# Patient Record
Sex: Male | Born: 1980
Health system: Southern US, Community
[De-identification: ages and names within clinical notes are randomized; demographics above are authoritative.]

## PROBLEM LIST (undated history)

## (undated) DIAGNOSIS — A6000 Herpesviral infection of urogenital system, unspecified: Secondary | ICD-10-CM

## (undated) HISTORY — DX: Herpesviral infection of urogenital system, unspecified: A60.00

---

## 2007-04-18 ENCOUNTER — Emergency Department: Payer: Self-pay | Admitting: Emergency Medicine

## 2008-08-12 ENCOUNTER — Emergency Department: Payer: Self-pay | Admitting: Emergency Medicine

## 2008-09-28 ENCOUNTER — Emergency Department: Payer: Self-pay | Admitting: Emergency Medicine

## 2010-11-22 ENCOUNTER — Emergency Department: Payer: Self-pay | Admitting: Internal Medicine

## 2010-12-19 ENCOUNTER — Emergency Department: Payer: Self-pay | Admitting: Emergency Medicine

## 2012-06-02 ENCOUNTER — Emergency Department: Payer: Self-pay | Admitting: Emergency Medicine

## 2012-06-02 LAB — BASIC METABOLIC PANEL WITH GFR
Anion Gap: 7
BUN: 14 mg/dL
Calcium, Total: 8.8 mg/dL
Chloride: 105 mmol/L
Co2: 28 mmol/L
Creatinine: 1.44 mg/dL — ABNORMAL HIGH
EGFR (African American): >60
EGFR (Non-African Amer.): >60
Glucose: 84 mg/dL
Osmolality: 279
Potassium: 4.2 mmol/L
Sodium: 140 mmol/L

## 2012-06-02 LAB — CBC WITH DIFFERENTIAL/PLATELET
Basophil %: 1.2 %
Eosinophil #: 0.1 10*3/uL (ref 0.0–0.7)
HCT: 48.2 % (ref 40.0–52.0)
HGB: 15.1 g/dL (ref 13.0–18.0)
Lymphocyte #: 1.7 10*3/uL (ref 1.0–3.6)
Lymphocyte %: 30.1 %
MCH: 24.4 pg — ABNORMAL LOW (ref 26.0–34.0)
MCHC: 31.4 g/dL — ABNORMAL LOW (ref 32.0–36.0)
MCV: 78 fL — ABNORMAL LOW (ref 80–100)
Monocyte #: 0.3 x10 3/mm (ref 0.2–1.0)
Neutrophil #: 3.4 10*3/uL (ref 1.4–6.5)
WBC: 5.6 10*3/uL (ref 3.8–10.6)

## 2012-06-02 LAB — URINALYSIS, COMPLETE
Bacteria: NONE SEEN
Bilirubin,UR: NEGATIVE
Blood: NEGATIVE
Glucose,UR: NEGATIVE mg/dL (ref 0–75)
Ketone: NEGATIVE
Leukocyte Esterase: NEGATIVE
Ph: 7 (ref 4.5–8.0)
RBC,UR: NONE SEEN /HPF (ref 0–5)
Squamous Epithelial: <1

## 2012-06-05 ENCOUNTER — Emergency Department: Payer: Self-pay | Admitting: *Deleted

## 2012-09-30 ENCOUNTER — Emergency Department: Payer: Self-pay | Admitting: Emergency Medicine

## 2012-09-30 LAB — URINALYSIS, COMPLETE
Blood: NEGATIVE
Glucose,UR: NEGATIVE mg/dL (ref 0–75)
Ketone: NEGATIVE
Leukocyte Esterase: NEGATIVE
Nitrite: NEGATIVE
Ph: 8 (ref 4.5–8.0)
Protein: NEGATIVE
RBC,UR: NONE SEEN /HPF (ref 0–5)
Specific Gravity: 1.019 (ref 1.003–1.030)
Squamous Epithelial: NONE SEEN

## 2012-09-30 LAB — COMPREHENSIVE METABOLIC PANEL
Albumin: 4.3 g/dL (ref 3.4–5.0)
BUN: 19 mg/dL — ABNORMAL HIGH (ref 7–18)
Bilirubin,Total: 1 mg/dL (ref 0.2–1.0)
Calcium, Total: 9.2 mg/dL (ref 8.5–10.1)
Chloride: 105 mmol/L (ref 98–107)
Co2: 32 mmol/L (ref 21–32)
Creatinine: 1.44 mg/dL — ABNORMAL HIGH (ref 0.60–1.30)
EGFR (African American): >60
EGFR (Non-African Amer.): >60
Glucose: 88 mg/dL (ref 65–99)
Osmolality: 281 (ref 275–301)
SGPT (ALT): 25 U/L (ref 12–78)
Sodium: 140 mmol/L (ref 136–145)

## 2012-09-30 LAB — CBC
HGB: 15.4 g/dL (ref 13.0–18.0)
MCHC: 31.3 g/dL — ABNORMAL LOW (ref 32.0–36.0)
RDW: 15.9 % — ABNORMAL HIGH (ref 11.5–14.5)
WBC: 6.8 10*3/uL (ref 3.8–10.6)

## 2012-09-30 LAB — CK TOTAL AND CKMB (NOT AT ARMC)
CK, Total: 218 U/L (ref 35–232)
CK-MB: 2.2 ng/mL (ref 0.5–3.6)

## 2012-09-30 LAB — LIPASE, BLOOD: Lipase: 93 U/L (ref 73–393)

## 2013-08-08 IMAGING — CT CT HEAD WITHOUT CONTRAST
2 series · 16 of 30 positions shown, 20 images · non-contrast
Comparison: none

REASON FOR EXAM: lightheadedness
COMMENTS:

PROCEDURE:     CT  - CT HEAD WITHOUT CONTRAST  - June 02, 2012  [DATE]
RESULT:     History: Lightheadedness.
Comparison Study: No prior.

[Series 2: without · axial · non-contrast · 0.44mm/px · z∈[-147,-22]mm · 13 of 31 slices shown, 17 images]
[im 3/31  brain]
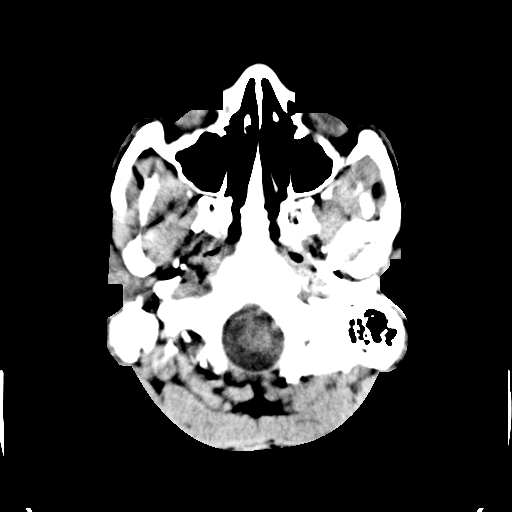
[im 3/31  bone]
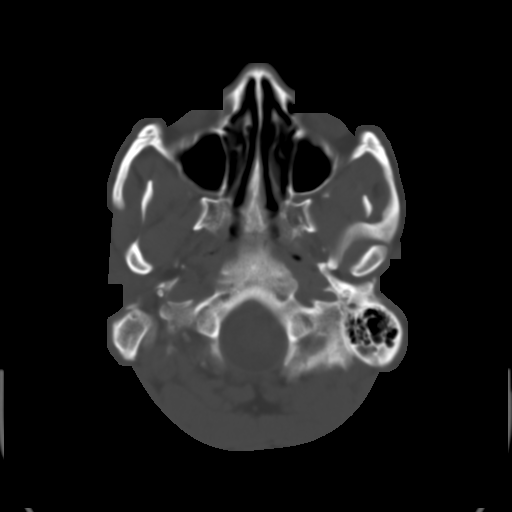
[im 5/31  brain]
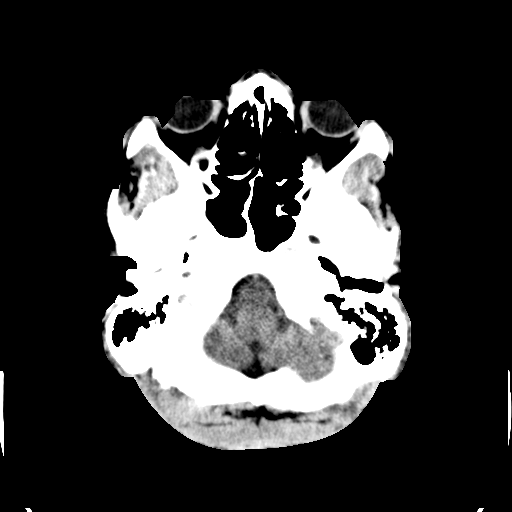
[im 7/31  brain]
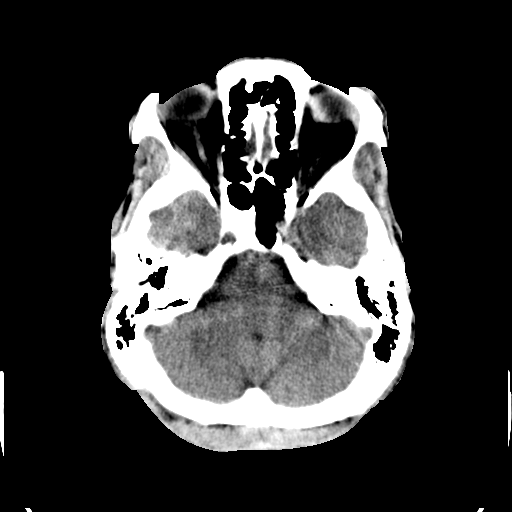
[im 9/31  brain]
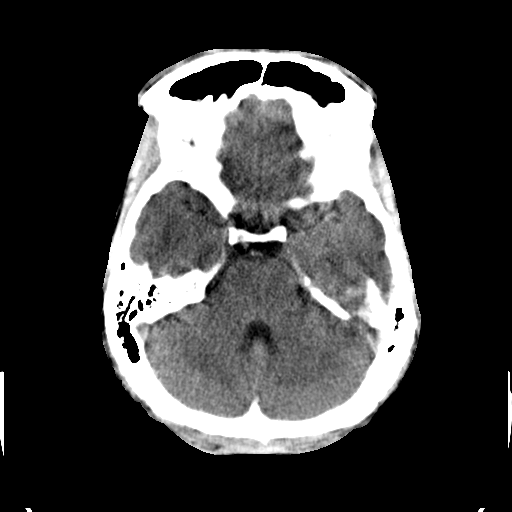
[im 11/31  brain]
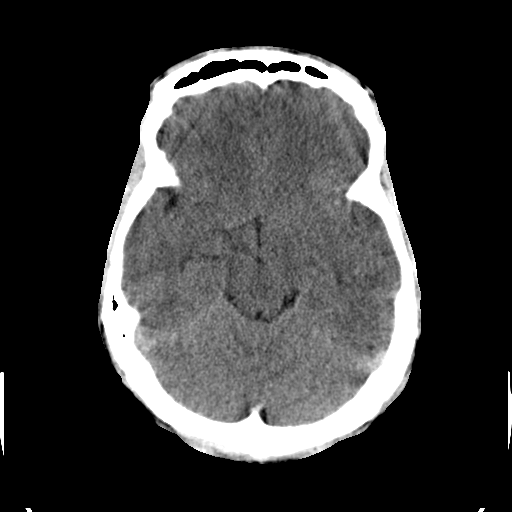
[im 11/31  bone]
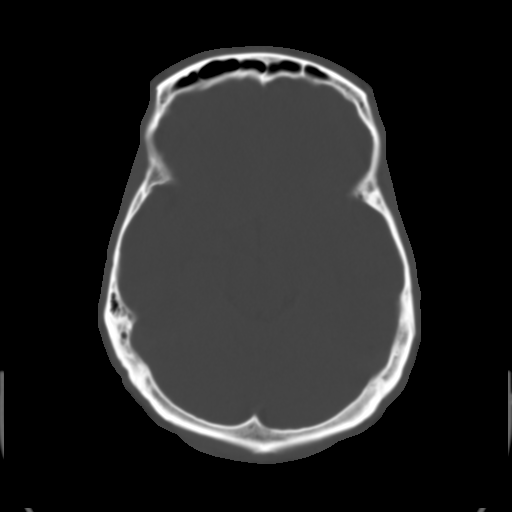
[im 13/31  brain]
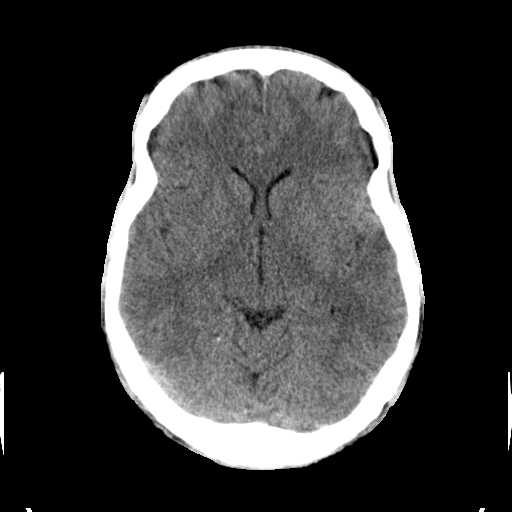
[im 16/31  brain]
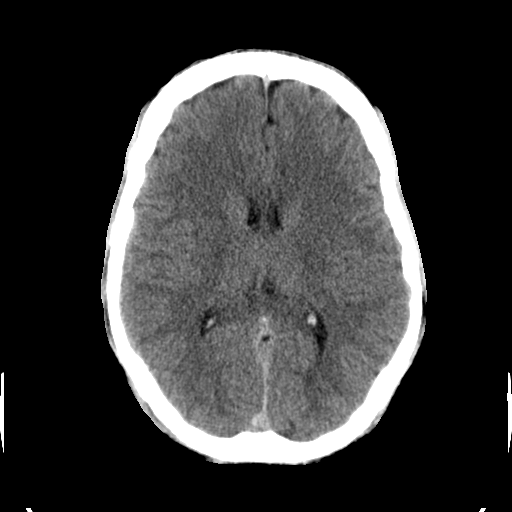
[im 18/31  brain]
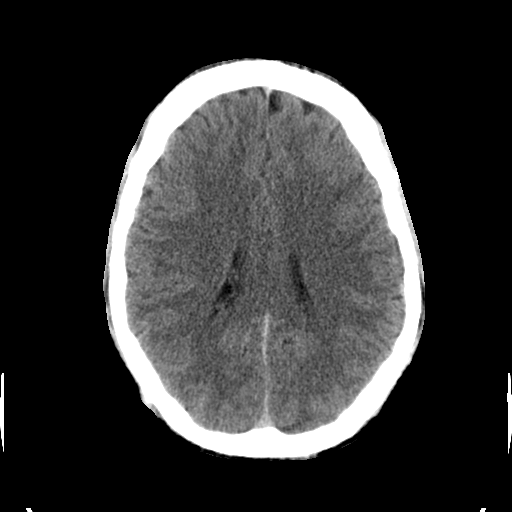
[im 20/31  brain]
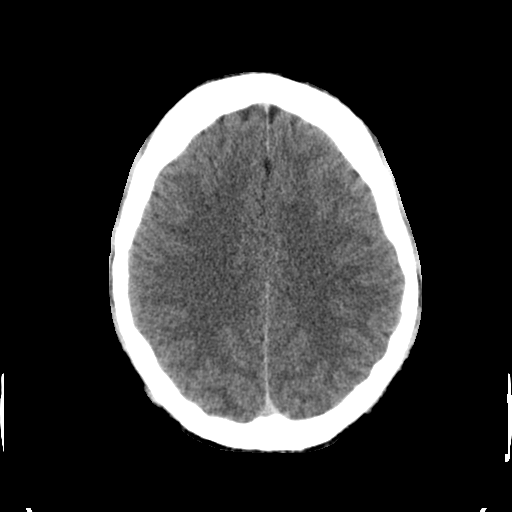
[im 20/31  bone]
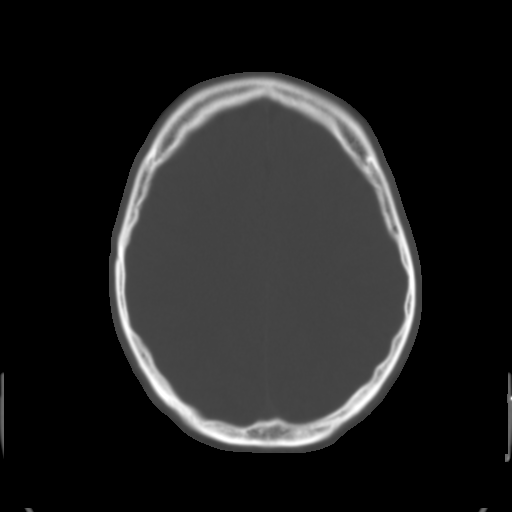
[im 22/31  brain]
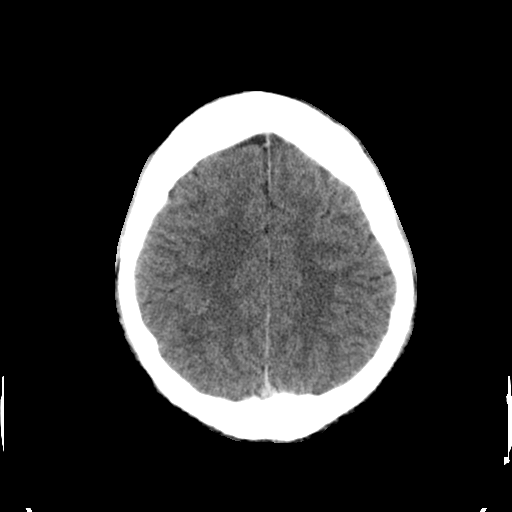
[im 24/31  brain]
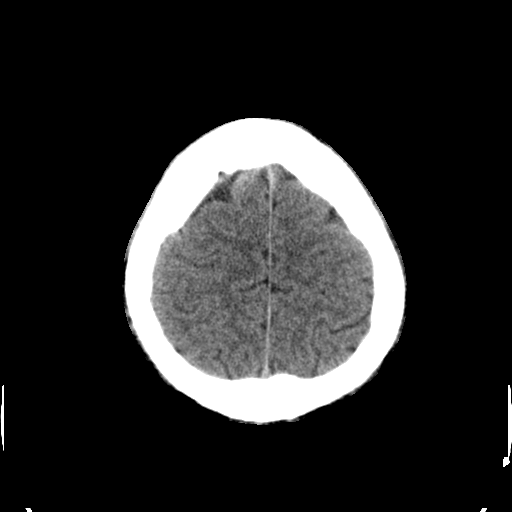
[im 26/31  brain]
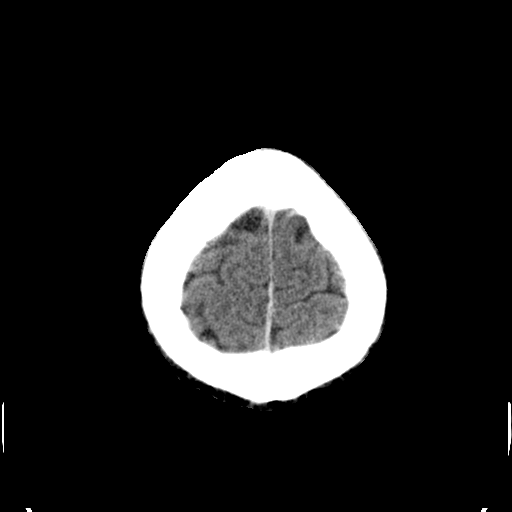
[im 28/31  brain]
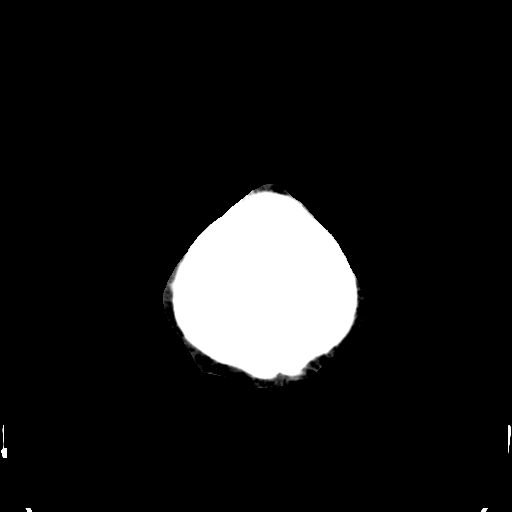
[im 28/31  bone]
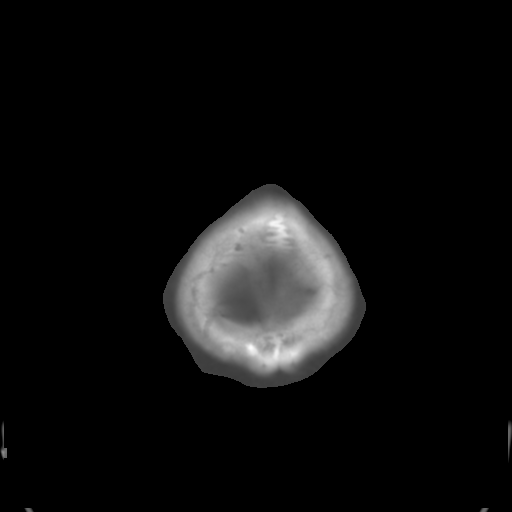

[Series 3: bone · axial · 0.44mm/px · z∈[-147,-107]mm · 3 of 31 slices shown]
[im 3/31  bone]
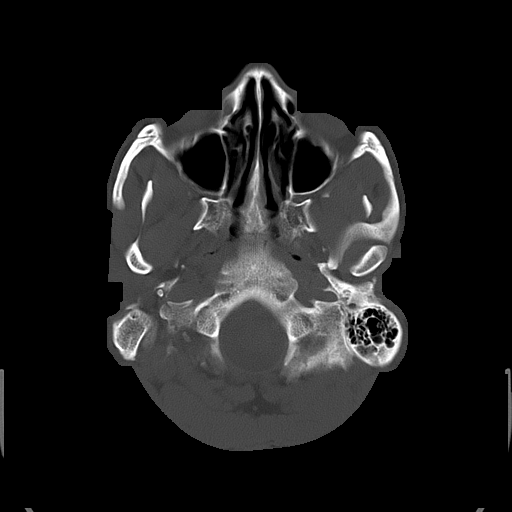
[im 7/31  bone]
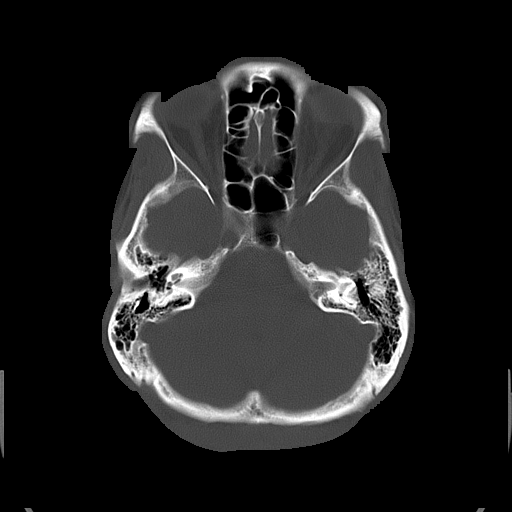
[im 11/31  bone]
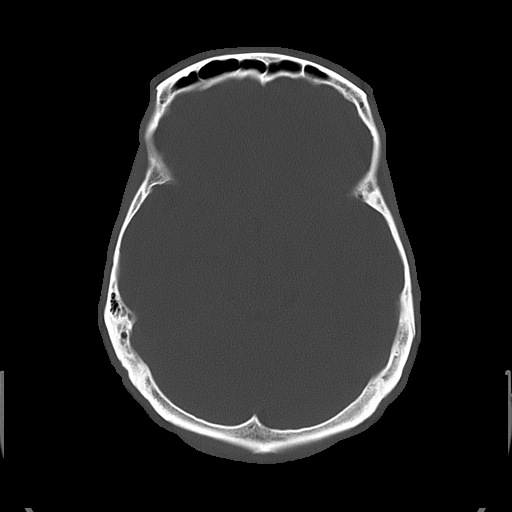

[16 of 30 positions shown; findings below may reference images not displayed]

FINDINGS: No mass. No hydrocephalus. No hemorrhage. Orbits and visualized
paranasal sinuses are clear. No acute bony abnormality.
IMPRESSION: No acute abnormality.

## 2013-12-06 IMAGING — CR DG CHEST 2V
1 series · 2 of 2 positions shown · non-contrast
Comparison: none

REASON FOR EXAM: chest pain
COMMENTS:

[Series 1: pa · 0.17mm/px · 2 of 2 slices shown]
[im 1/2]
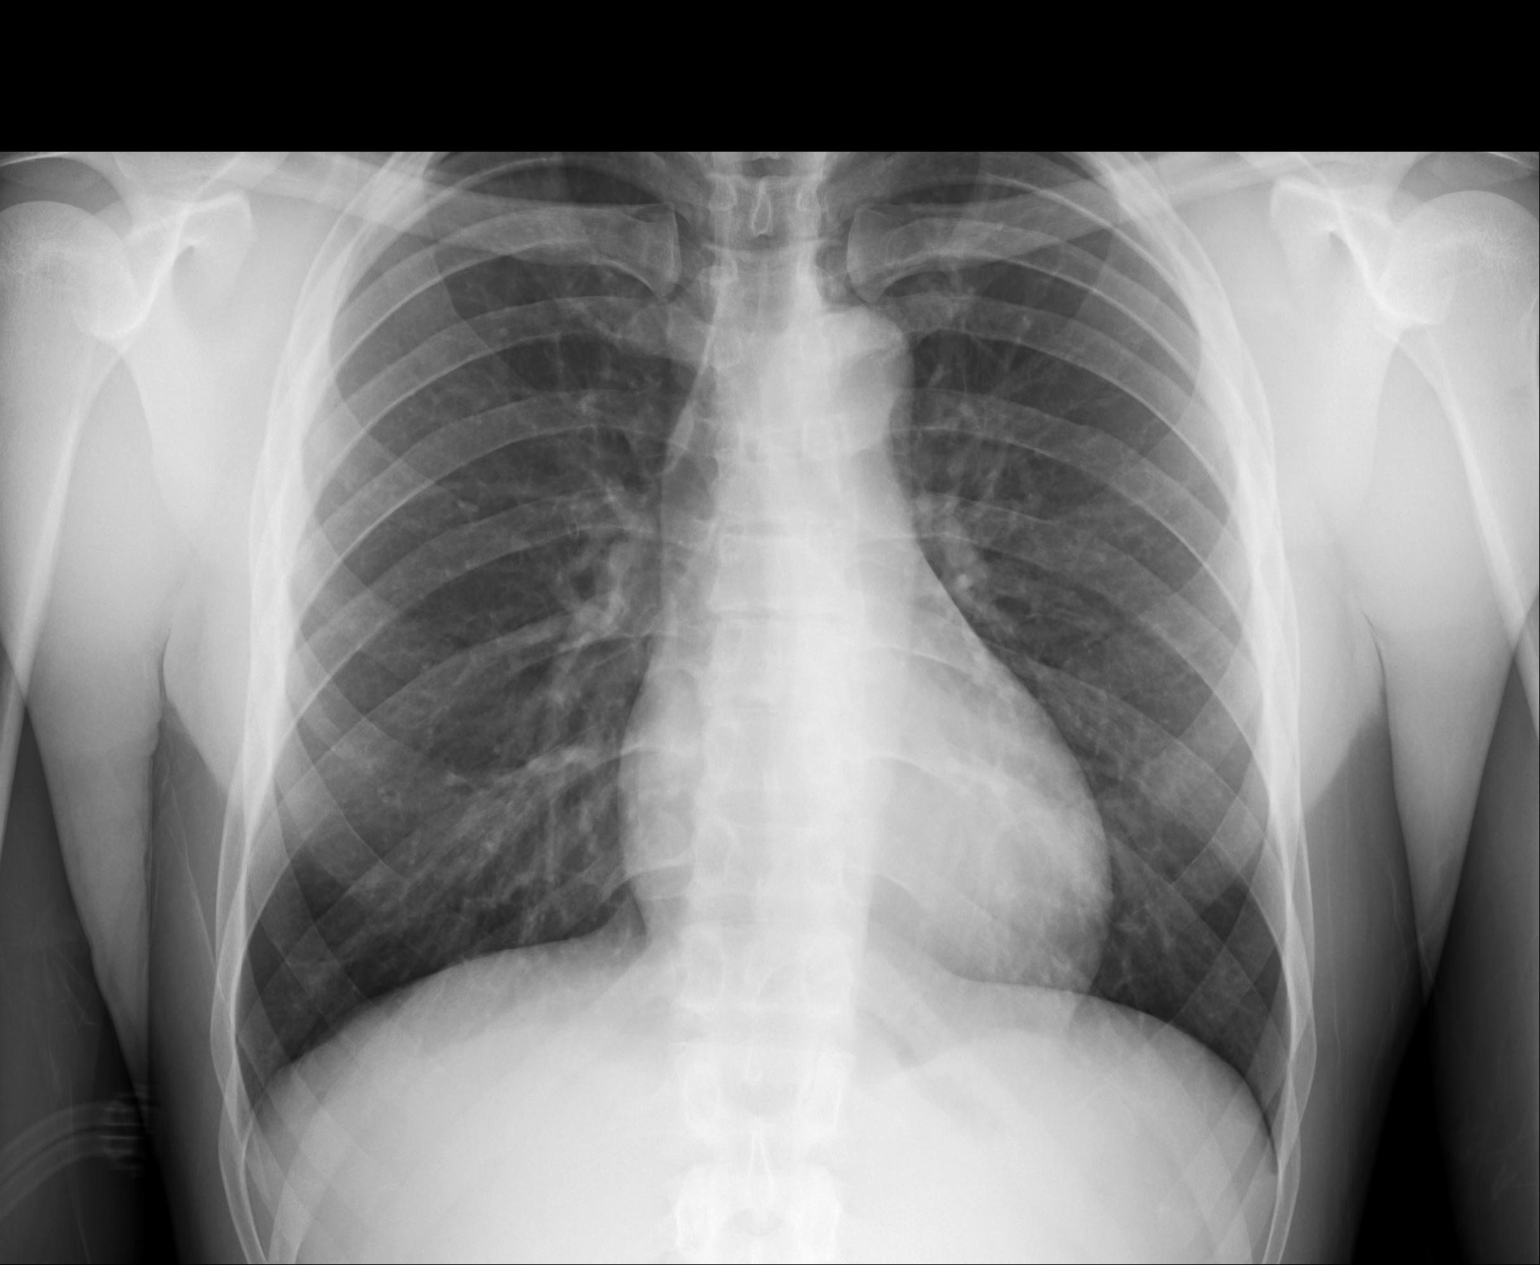
[im 2/2]
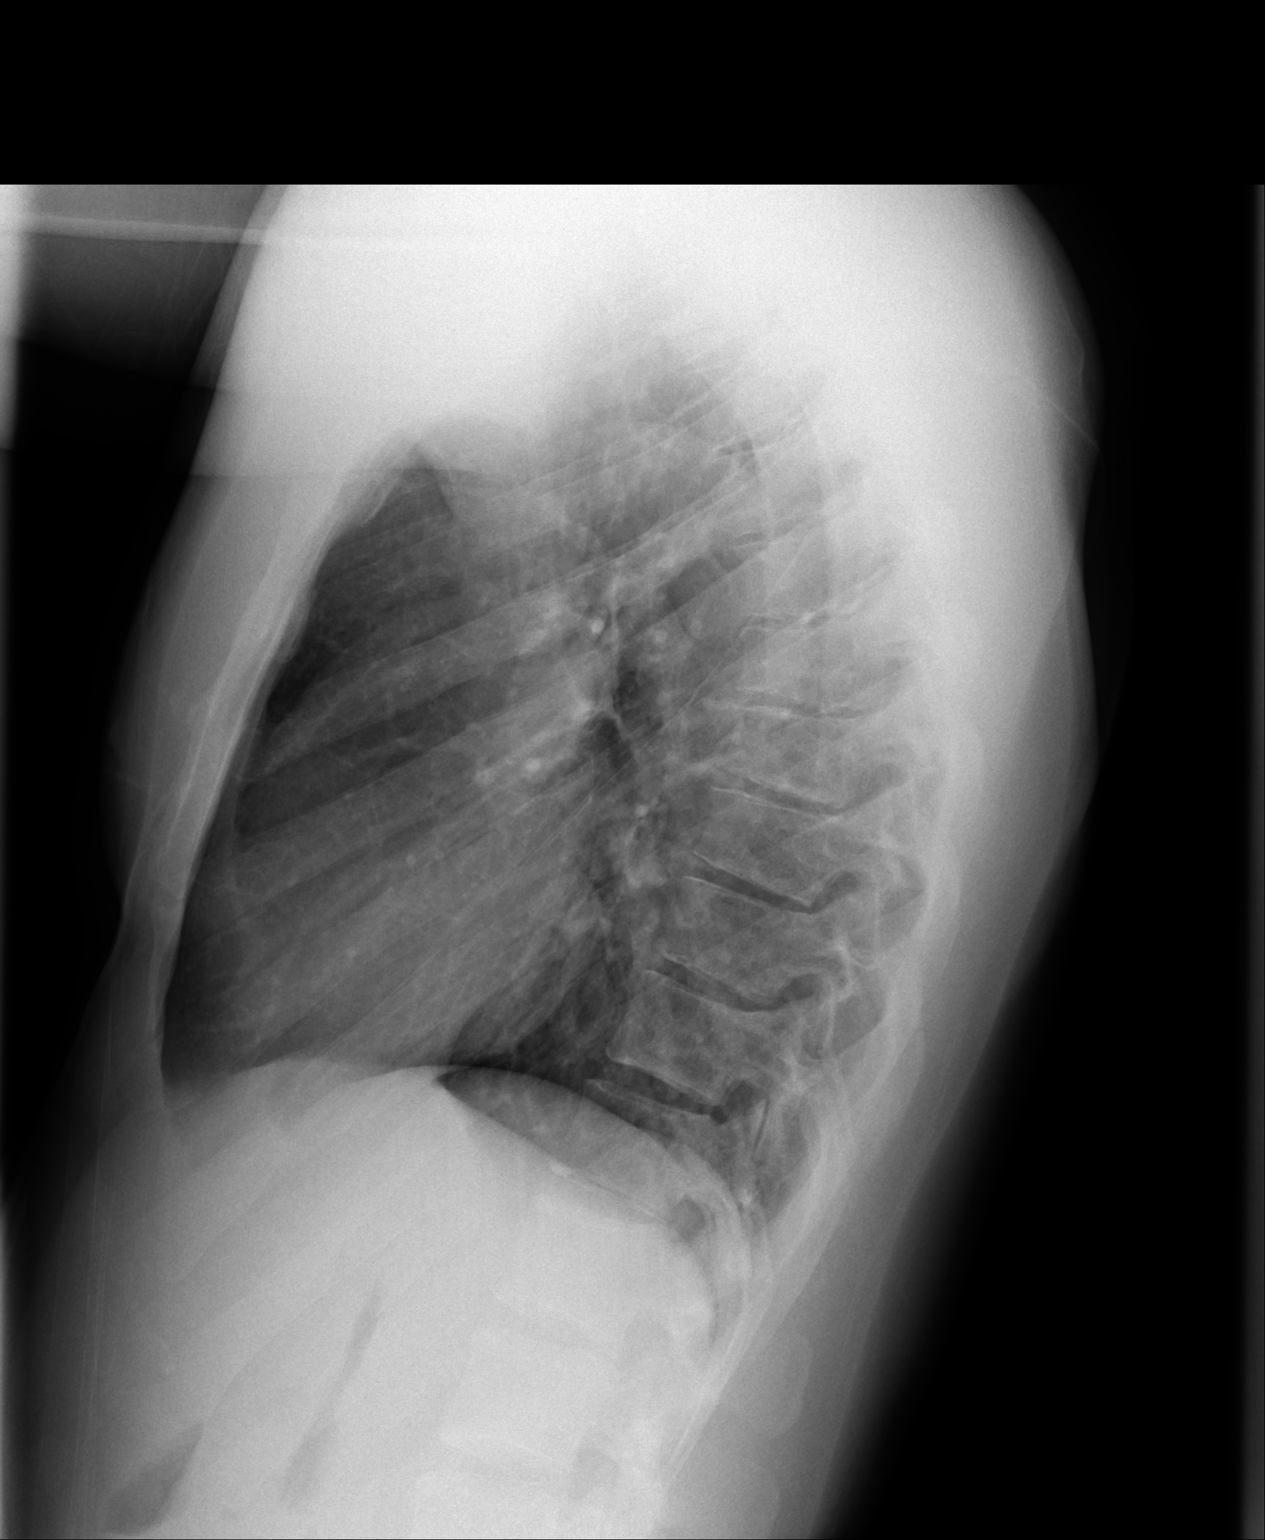

[2 of 2 positions shown; findings below may reference images not displayed]

PROCEDURE:     DXR - DXR CHEST PA (OR AP) AND LATERAL  - September 30, 2012 [DATE]

RESULT:     Comparison is made to study June 02, 2012.

The lungs are mildly hyperinflated and clear. The cardiac silhouette is
normal in size. The mediastinum is normal in width. There is no pleural
effusion. The bony thorax exhibits no acute abnormality. There is no
evidence of a pneumothorax nor pneumomediastinum.
IMPRESSION: There is no evidence of acute cardiopulmonary abnormality.
Mild hyperinflation may be voluntary or could reflect underlying reactive
airway disease.

[REDACTED]

## 2015-07-26 ENCOUNTER — Other Ambulatory Visit: Payer: Self-pay | Admitting: Family Medicine

## 2015-08-24 ENCOUNTER — Other Ambulatory Visit: Payer: Self-pay | Admitting: Family Medicine

## 2015-08-24 ENCOUNTER — Encounter: Payer: Self-pay | Admitting: Family Medicine

## 2015-08-24 ENCOUNTER — Ambulatory Visit (INDEPENDENT_AMBULATORY_CARE_PROVIDER_SITE_OTHER): Payer: Managed Care, Other (non HMO) | Admitting: Family Medicine

## 2015-08-24 VITALS — BP 150/100 | HR 60 | Resp 16 | Ht 69.0 in | Wt 166.4 lb

## 2015-08-24 DIAGNOSIS — Z789 Other specified health status: Secondary | ICD-10-CM | POA: Diagnosis not present

## 2015-08-24 DIAGNOSIS — A6000 Herpesviral infection of urogenital system, unspecified: Secondary | ICD-10-CM

## 2015-08-24 DIAGNOSIS — I10 Essential (primary) hypertension: Secondary | ICD-10-CM | POA: Diagnosis not present

## 2015-08-24 DIAGNOSIS — Z23 Encounter for immunization: Secondary | ICD-10-CM | POA: Diagnosis not present

## 2015-08-24 MED ORDER — VALACYCLOVIR HCL 500 MG PO TABS: 500.0000 mg | ORAL_TABLET | Freq: Two times a day (BID) | ORAL | Status: DC

## 2015-08-24 MED ORDER — LISINOPRIL 5 MG PO TABS: 5.0000 mg | ORAL_TABLET | Freq: Every day | ORAL | Status: DC

## 2015-08-24 NOTE — Addendum Note (Signed)
Addended by: Venora Maples on: 08/24/2015 05:53 PM   Modules accepted: Orders

## 2015-08-24 NOTE — Progress Notes (Signed)
I have reordered this med at [facility] in [place] already.-jh

## 2015-08-24 NOTE — Progress Notes (Signed)
Name: Carl Rice   MRN: [id number]    DOB: 12/18/1980   Date:08/24/2015       Progress Note  Subjective  Chief Complaint  Chief Complaint  Patient presents with  . Annual Exam    HPI  Here for annual physical exam.  No medical problems.  No surgery.  Nothing bothering him.   Has hx. Of genital herpes.  Had taken acyclovir in past.  No problem-specific assessment & plan notes found for this encounter.   Past Medical History  Diagnosis Date  . Genital herpes     History reviewed. No pertinent past surgical history.  Family History  Problem Relation Age of Onset  . Hypertension Mother   . Drug abuse Mother   . Drug abuse Father   . Kidney disease Son     Social History   Social History  . Marital Status: Single    Spouse Name: N/A  . Number of Children: N/A  . Years of Education: N/A   Occupational History  . Not on file.   Social History Main Topics  . Smoking status: Never Smoker   . Smokeless tobacco: Never Used  . Alcohol Use: Yes  . Drug Use: Yes  . Sexual Activity: Not on file   Other Topics Concern  . Not on file   Social History Narrative  . No narrative on file     Current outpatient prescriptions:  .  lisinopril (PRINIVIL,ZESTRIL) 5 MG tablet, Take 1 tablet (5 mg total) by mouth daily., Disp: 90 tablet, Rfl: 3 .  valACYclovir (VALTREX) 500 MG tablet, Take 1 tablet (500 mg total) by mouth 2 (two) times daily., Disp: 10 tablet, Rfl: 12  No Known Allergies   Review of Systems  Constitutional: Negative for fever, chills, weight loss and malaise/fatigue.  HENT: Negative for hearing loss.   Eyes: Negative for blurred vision and double vision.  Respiratory: Negative for cough, sputum production, shortness of breath and wheezing.   Cardiovascular: Negative for chest pain, palpitations, orthopnea, leg swelling and PND.  Gastrointestinal: Negative for heartburn, nausea, vomiting, abdominal pain and blood in stool.  Genitourinary: Negative for  dysuria, urgency and frequency.  Musculoskeletal: Negative for myalgias, back pain and neck pain.  Skin: Negative for rash.       Hx. Of genital herpes about 2-4 times a year.  Neurological: Negative for dizziness, sensory change, focal weakness, weakness and headaches.  Psychiatric/Behavioral: Negative for depression. The patient is not nervous/anxious.       Objective  Filed Vitals:   08/24/15 1623 08/24/15 1725  BP: 142/93 150/100  Pulse: 60   Resp: 16   Height:  (1.753 m)   Weight: 166 lb 6.4 oz (75.479 kg)     Physical Exam  Constitutional: He is oriented to person, place, and time and well-developed, well-nourished, and in no distress. No distress.  HENT:  Head: Normocephalic and atraumatic.  Right Ear: External ear normal.  Left Ear: External ear normal.  Nose: Nose normal.  Mouth/Throat: Oropharynx is clear and moist.  Eyes: Conjunctivae and EOM are normal. Pupils are equal, round, and reactive to light. No scleral icterus.  Fundoscopic exam:      The right eye shows no arteriolar narrowing, no AV nicking, no exudate and no hemorrhage.       The left eye shows no arteriolar narrowing, no AV nicking, no exudate and no hemorrhage.  Neck: Normal range of motion. Neck supple. Carotid bruit is not present. No thyromegaly present.  Cardiovascular: Normal rate, regular rhythm, normal heart sounds and intact distal pulses.  Exam reveals no gallop and no friction rub.   No murmur heard. Pulmonary/Chest: Effort normal and breath sounds normal. No respiratory distress. He has no wheezes. He has no rales.  Abdominal: Soft. He exhibits no distension, no abdominal bruit and no mass. There is no tenderness.  Genitourinary: Penis normal. He exhibits no abnormal testicular mass, no testicular tenderness, no abnormal scrotal mass and no scrotal tenderness. Penis exhibits no lesions. No discharge found.  Musculoskeletal: Normal range of motion. He exhibits no edema or tenderness.   Lymphadenopathy:    He has no cervical adenopathy.  Neurological: He is alert and oriented to person, place, and time.  Skin: Skin is warm and dry. No rash noted. No erythema. No pallor.  Vitals reviewed.      No results found for this or any previous visit (from the past 2160 hour(s)).   Assessment & Plan  Problem List Items Addressed This Visit      Cardiovascular and Mediastinum   Essential hypertension   Relevant Medications   lisinopril (PRINIVIL,ZESTRIL) 5 MG tablet   Other Relevant Orders   Comprehensive Metabolic Panel (CMET)   Lipid Profile   CBC with Differential     Genitourinary   Genital herpes   Relevant Medications   valACYclovir (VALTREX) 500 MG tablet     Other   Health maintenance alteration - Primary   Need for influenza vaccination   Relevant Orders   Flu Vaccine QUAD 36+ mos PF IM (Fluarix & Fluzone Quad PF)      Meds ordered this encounter  Medications  . DISCONTD: acyclovir (ZOVIRAX) 800 MG tablet    Sig:     Refill:  1  . DISCONTD: valACYclovir (VALTREX) 500 MG tablet    Sig: Take 1 tablet (500 mg total) by mouth 2 (two) times daily.    Dispense:  10 tablet    Refill:  12  . lisinopril (PRINIVIL,ZESTRIL) 5 MG tablet    Sig: Take 1 tablet (5 mg total) by mouth daily.    Dispense:  90 tablet    Refill:  3  . valACYclovir (VALTREX) 500 MG tablet    Sig: Take 1 tablet (500 mg total) by mouth 2 (two) times daily.    Dispense:  10 tablet    Refill:  12   1. Health maintenance alteration   2. Genital herpes  - valACYclovir (VALTREX) 500 MG tablet; Take 1 tablet (500 mg total) by mouth 2 (two) times daily.  Dispense: 10 tablet; Refill: 12  3. Essential hypertension  - lisinopril (PRINIVIL,ZESTRIL) 5 MG tablet; Take 1 tablet (5 mg total) by mouth daily.  Dispense: 90 tablet; Refill: 3 - Comprehensive Metabolic Panel (CMET) - Lipid Profile - CBC with Differential  4. Need for influenza vaccination  - Flu Vaccine QUAD 36+ mos PF  IM (Fluarix & Fluzone Quad PF)

## 2015-08-24 NOTE — Progress Notes (Signed)
Medication reordered.-jh

## 2015-10-26 ENCOUNTER — Ambulatory Visit (INDEPENDENT_AMBULATORY_CARE_PROVIDER_SITE_OTHER): Payer: Managed Care, Other (non HMO) | Admitting: Family Medicine

## 2015-10-26 ENCOUNTER — Encounter: Payer: Self-pay | Admitting: Family Medicine

## 2015-10-26 VITALS — BP 150/100 | HR 64 | Resp 16 | Ht 69.0 in | Wt 164.6 lb

## 2015-10-26 DIAGNOSIS — I10 Essential (primary) hypertension: Secondary | ICD-10-CM

## 2015-10-26 MED ORDER — LISINOPRIL 10 MG PO TABS: 10.0000 mg | ORAL_TABLET | Freq: Every day | ORAL | Status: DC

## 2015-10-26 NOTE — Progress Notes (Signed)
Name: Carl Rice   MRN: [id number]    DOB: [birth year]   Date:10/26/2015       Progress Note  Subjective  Chief Complaint  Chief Complaint  Patient presents with  . Hypertension    HPI Here for f/u of HBP.  He took Lisinopril for 1 week, but then he stopped it.  Wanted to do without it.  Now realizes that he will need it.  No problem-specific assessment & plan notes found for this encounter.   Past Medical History  Diagnosis Date  . Genital herpes     History reviewed. No pertinent past surgical history.  Family History  Problem Relation Age of Onset  . Hypertension Mother   . Drug abuse Mother   . Drug abuse Father   . Kidney disease Son     Social History   Social History  . Marital Status: Single    Spouse Name: N/A  . Number of Children: N/A  . Years of Education: N/A   Occupational History  . Not on file.   Social History Main Topics  . Smoking status: Never Smoker   . Smokeless tobacco: Never Used  . Alcohol Use: Yes  . Drug Use: Yes  . Sexual Activity: Not on file   Other Topics Concern  . Not on file   Social History Narrative     Current outpatient prescriptions:  .  valACYclovir (VALTREX) 500 MG tablet, Take 1 tablet (500 mg total) by mouth 2 (two) times daily. Take for 5 days when needed., Disp: 10 tablet, Rfl: 12 .  lisinopril (PRINIVIL,ZESTRIL) 10 MG tablet, Take 1 tablet (10 mg total) by mouth daily., Disp: 30 tablet, Rfl: 6  No Known Allergies   Review of Systems  Constitutional: Negative for fever, chills, weight loss and malaise/fatigue.  HENT: Negative for hearing loss.   Eyes: Negative for blurred vision and double vision.  Respiratory: Negative for cough, shortness of breath and wheezing.   Cardiovascular: Negative for chest pain, palpitations and leg swelling.  Gastrointestinal: Negative for heartburn, abdominal pain and blood in stool.  Genitourinary: Negative for dysuria, urgency and frequency.  Musculoskeletal: Negative  for myalgias and joint pain.  Neurological: Negative for dizziness, tremors and weakness.      Objective  Filed Vitals:   10/26/15 1613 10/26/15 1634  BP: 154/103 150/100  Pulse: 64   Resp: 16   Height: 5\' 9"  (1.753 m)   Weight: 164 lb 9.6 oz (74.662 kg)     Physical Exam  Constitutional: He is well-developed, well-nourished, and in no distress. No distress.  HENT:  Head: Normocephalic and atraumatic.  Eyes: Conjunctivae and EOM are normal. Pupils are equal, round, and reactive to light. No scleral icterus.  Neck: Normal range of motion. Carotid bruit is not present. No thyromegaly present.  Cardiovascular: Normal rate, regular rhythm, normal heart sounds and intact distal pulses.  Exam reveals no gallop and no friction rub.   No murmur heard. Pulmonary/Chest: Effort normal and breath sounds normal. No respiratory distress. He has no wheezes. He has no rales.  Abdominal: Soft. Bowel sounds are normal. He exhibits no distension, no abdominal bruit and no mass. There is no tenderness.  Musculoskeletal: He exhibits no edema.  Lymphadenopathy:    He has no cervical adenopathy.  Vitals reviewed.      No results found for this or any previous visit (from the past 2160 hour(s)).   Assessment & Plan  Problem List Items Addressed This Visit  Cardiovascular and Mediastinum   Essential hypertension - Primary   Relevant Medications   lisinopril (PRINIVIL,ZESTRIL) 10 MG tablet      Meds ordered this encounter  Medications  . lisinopril (PRINIVIL,ZESTRIL) 10 MG tablet    Sig: Take 1 tablet (10 mg total) by mouth daily.    Dispense:  30 tablet    Refill:  6  1. Essential hypertension  - lisinopril (PRINIVIL,ZESTRIL) 10 MG tablet; Take 1 tablet (10 mg total) by mouth daily.  Dispense: 30 tablet; Refill: 6  (increased from 5 mg/d).

## 2015-12-02 ENCOUNTER — Ambulatory Visit (INDEPENDENT_AMBULATORY_CARE_PROVIDER_SITE_OTHER): Payer: Managed Care, Other (non HMO) | Admitting: Family Medicine

## 2015-12-02 ENCOUNTER — Encounter: Payer: Self-pay | Admitting: Family Medicine

## 2015-12-02 VITALS — BP 145/105 | HR 59 | Temp 97.8°F | Resp 16 | Ht 69.0 in | Wt 163.0 lb

## 2015-12-02 DIAGNOSIS — I10 Essential (primary) hypertension: Secondary | ICD-10-CM

## 2015-12-02 MED ORDER — LISINOPRIL 40 MG PO TABS: 40.0000 mg | ORAL_TABLET | Freq: Every day | ORAL | Status: DC

## 2015-12-02 NOTE — Progress Notes (Signed)
Name: Carl Rice   MRN: [id number]    DOB: Sep 29, 1981   Date:12/02/2015       Progress Note  Subjective  Chief Complaint  Chief Complaint  Patient presents with  . Hypertension    HPI Here for f/u of HBP.  Taking Lisinopril 10 mg/d as directed.  Feeling ok.  No problems with medication.  No problem-specific assessment & plan notes found for this encounter.   Past Medical History  Diagnosis Date  . Genital herpes     History reviewed. No pertinent past surgical history.  Family History  Problem Relation Age of Onset  . Hypertension Mother   . Drug abuse Mother   . Drug abuse Father   . Kidney disease Son     Social History   Social History  . Marital Status: Single    Spouse Name: N/A  . Number of Children: N/A  . Years of Education: N/A   Occupational History  . Not on file.   Social History Main Topics  . Smoking status: Never Smoker   . Smokeless tobacco: Never Used  . Alcohol Use: 0.0 oz/week    0 Standard drinks or equivalent per week  . Drug Use: No  . Sexual Activity: Not on file   Other Topics Concern  . Not on file   Social History Narrative     Current outpatient prescriptions:  .  valACYclovir (VALTREX) 500 MG tablet, Take 1 tablet (500 mg total) by mouth 2 (two) times daily. Take for 5 days when needed., Disp: 10 tablet, Rfl: 12 .  lisinopril (PRINIVIL,ZESTRIL) 40 MG tablet, Take 1 tablet (40 mg total) by mouth daily., Disp: 30 tablet, Rfl: 6  Not on File   Review of Systems  Constitutional: Negative for fever, chills, weight loss and malaise/fatigue.  HENT: Negative for hearing loss.   Eyes: Negative for blurred vision and double vision.  Respiratory: Negative for cough, shortness of breath and wheezing.   Cardiovascular: Negative for chest pain, palpitations and leg swelling.  Gastrointestinal: Negative for heartburn, abdominal pain and blood in stool.  Genitourinary: Negative for dysuria, urgency and frequency.  Musculoskeletal:  Negative for myalgias and joint pain.  Skin: Negative for rash.  Neurological: Negative for dizziness, tremors, weakness and headaches.      Objective  Filed Vitals:   12/02/15 1424 12/02/15 1507  BP: 148/105 145/105  Pulse: 59   Temp: 97.8 F (36.6 C)   TempSrc: Oral   Resp: 16   Height: 5\' 9"  (1.753 m)   Weight: 163 lb (73.936 kg)     Physical Exam  Constitutional: He is well-developed, well-nourished, and in no distress. No distress.  HENT:  Head: Normocephalic and atraumatic.  Eyes: Conjunctivae and EOM are normal. Pupils are equal, round, and reactive to light. No scleral icterus.  Neck: Normal range of motion. Neck supple. Carotid bruit is not present. No thyromegaly present.  Cardiovascular: Normal rate, regular rhythm and normal heart sounds.  Exam reveals no gallop and no friction rub.   No murmur heard. Pulmonary/Chest: Effort normal and breath sounds normal. No respiratory distress. He has no wheezes. He has no rales.  Abdominal: Soft. Bowel sounds are normal. He exhibits no distension, no abdominal bruit and no mass. There is no tenderness.  Musculoskeletal: He exhibits no edema.  Lymphadenopathy:    He has no cervical adenopathy.  Vitals reviewed.      No results found for this or any previous visit (from the past 2160 hour(s)).  Assessment & Plan  Problem List Items Addressed This Visit      Cardiovascular and Mediastinum   Essential hypertension - Primary   Relevant Medications   lisinopril (PRINIVIL,ZESTRIL) 40 MG tablet      Meds ordered this encounter  Medications  . DISCONTD: lisinopril (PRINIVIL,ZESTRIL) 5 MG tablet    Sig: Reported on 12/02/2015  . lisinopril (PRINIVIL,ZESTRIL) 40 MG tablet    Sig: Take 1 tablet (40 mg total) by mouth daily.    Dispense:  30 tablet    Refill:  6   1. Essential hypertension  - lisinopril (PRINIVIL,ZESTRIL) 40 MG tablet; Take 1 tablet (40 mg total) by mouth daily.  Dispense: 30 tablet; Refill:  6

## 2016-01-17 ENCOUNTER — Ambulatory Visit (INDEPENDENT_AMBULATORY_CARE_PROVIDER_SITE_OTHER): Payer: Managed Care, Other (non HMO) | Admitting: Family Medicine

## 2016-01-17 ENCOUNTER — Encounter: Payer: Self-pay | Admitting: Family Medicine

## 2016-01-17 ENCOUNTER — Ambulatory Visit: Payer: Managed Care, Other (non HMO) | Admitting: Family Medicine

## 2016-01-17 VITALS — BP 146/85 | HR 65 | Resp 16 | Ht 69.0 in | Wt 160.0 lb

## 2016-01-17 DIAGNOSIS — A6000 Herpesviral infection of urogenital system, unspecified: Secondary | ICD-10-CM

## 2016-01-17 DIAGNOSIS — I10 Essential (primary) hypertension: Secondary | ICD-10-CM | POA: Diagnosis not present

## 2016-01-17 MED ORDER — AMLODIPINE BESYLATE 2.5 MG PO TABS: 2.5000 mg | ORAL_TABLET | Freq: Every day | ORAL | Status: DC

## 2016-01-17 NOTE — Progress Notes (Signed)
Name: Carl Rice   MRN: [id number]    DOB: 04-27-1981   Date:01/17/2016       Progress Note  Subjective  Chief Complaint  Chief Complaint  Patient presents with  . Hypertension    HPI Here for f/u of HBP.  Taking his Lisinopril.  No problems.   No problem-specific assessment & plan notes found for this encounter.   Past Medical History  Diagnosis Date  . Genital herpes     History reviewed. No pertinent past surgical history.  Family History  Problem Relation Age of Onset  . Hypertension Mother   . Drug abuse Mother   . Drug abuse Father   . Kidney disease Son     Social History   Social History  . Marital Status: Single    Spouse Name: N/A  . Number of Children: N/A  . Years of Education: N/A   Occupational History  . Not on file.   Social History Main Topics  . Smoking status: Never Smoker   . Smokeless tobacco: Never Used  . Alcohol Use: 0.0 oz/week    0 Standard drinks or equivalent per week  . Drug Use: No  . Sexual Activity: Not on file   Other Topics Concern  . Not on file   Social History Narrative     Current outpatient prescriptions:  .  amLODipine (NORVASC) 2.5 MG tablet, Take 1 tablet (2.5 mg total) by mouth daily., Disp: 30 tablet, Rfl: 6 .  lisinopril (PRINIVIL,ZESTRIL) 40 MG tablet, Take 1 tablet (40 mg total) by mouth daily., Disp: 30 tablet, Rfl: 6 .  valACYclovir (VALTREX) 500 MG tablet, Take 1 tablet (500 mg total) by mouth 2 (two) times daily. Take for 5 days when needed., Disp: 10 tablet, Rfl: 12  Not on File   Review of Systems  Constitutional: Negative for fever, chills, weight loss and malaise/fatigue.  HENT: Negative for hearing loss.   Eyes: Negative for blurred vision and double vision.  Respiratory: Negative for cough, shortness of breath and wheezing.   Cardiovascular: Negative for chest pain, palpitations and leg swelling.  Gastrointestinal: Negative for heartburn, abdominal pain and blood in stool.  Genitourinary:  Negative for dysuria, urgency and frequency.  Musculoskeletal: Negative for myalgias and joint pain.  Skin: Negative for rash.  Neurological: Negative for dizziness, tremors, weakness and headaches.      Objective  Filed Vitals:   01/17/16 0920  BP: 146/85  Pulse: 65  Resp: 16  Height:  (1.753 m)  Weight: 160 lb (72.576 kg)    Physical Exam  Constitutional: He is oriented to person, place, and time and well-developed, well-nourished, and in no distress. No distress.  HENT:  Head: Normocephalic and atraumatic.  Eyes: Conjunctivae and EOM are normal. Pupils are equal, round, and reactive to light. No scleral icterus.  Neck: Normal range of motion. Neck supple. No thyromegaly present.  Cardiovascular: Normal rate and regular rhythm.  Exam reveals no gallop and no friction rub.   No murmur heard. Pulmonary/Chest: Effort normal and breath sounds normal. No respiratory distress. He has no wheezes. He has no rales.  Musculoskeletal: He exhibits no edema.  Lymphadenopathy:    He has no cervical adenopathy.  Neurological: He is alert and oriented to person, place, and time.  Vitals reviewed.      No results found for this or any previous visit (from the past 2160 hour(s)).   Assessment & Plan  Problem List Items Addressed This Visit  Cardiovascular and Mediastinum   Essential hypertension - Primary   Relevant Medications   amLODipine (NORVASC) 2.5 MG tablet     Genitourinary   Genital herpes      Meds ordered this encounter  Medications  . amLODipine (NORVASC) 2.5 MG tablet    Sig: Take 1 tablet (2.5 mg total) by mouth daily.    Dispense:  30 tablet    Refill:  6   1. Essential hypertension Cont. Lisinopril, 40 mg/d. - amLODipine (NORVASC) 2.5 MG tablet; Take 1 tablet (2.5 mg total) by mouth daily.  Dispense: 30 tablet; Refill: 6  2. Genital herpes UJse Valtrex prn

## 2016-02-28 ENCOUNTER — Other Ambulatory Visit: Payer: Self-pay | Admitting: Family Medicine

## 2016-02-28 DIAGNOSIS — I10 Essential (primary) hypertension: Secondary | ICD-10-CM

## 2016-02-28 MED ORDER — LISINOPRIL 40 MG PO TABS: 40.0000 mg | ORAL_TABLET | Freq: Every day | ORAL | Status: DC

## 2016-03-16 ENCOUNTER — Ambulatory Visit (INDEPENDENT_AMBULATORY_CARE_PROVIDER_SITE_OTHER): Payer: Managed Care, Other (non HMO) | Admitting: Family Medicine

## 2016-03-16 ENCOUNTER — Encounter: Payer: Self-pay | Admitting: Family Medicine

## 2016-03-16 ENCOUNTER — Ambulatory Visit: Payer: Managed Care, Other (non HMO) | Admitting: Family Medicine

## 2016-03-16 VITALS — BP 140/76 | HR 54 | Resp 16 | Ht 69.0 in | Wt 164.0 lb

## 2016-03-16 DIAGNOSIS — I10 Essential (primary) hypertension: Secondary | ICD-10-CM | POA: Diagnosis not present

## 2016-03-16 DIAGNOSIS — E785 Hyperlipidemia, unspecified: Secondary | ICD-10-CM | POA: Diagnosis not present

## 2016-03-16 DIAGNOSIS — R4589 Other symptoms and signs involving emotional state: Secondary | ICD-10-CM

## 2016-03-16 DIAGNOSIS — F603 Borderline personality disorder: Secondary | ICD-10-CM

## 2016-03-16 NOTE — Assessment & Plan Note (Signed)
BP controlled on recheck. Encouraged pt to check at home call if consistently elevated > 140/90.  Check CMET. Recheck 3 mos.

## 2016-03-16 NOTE — Patient Instructions (Signed)
Your goal blood pressure is 140/90- please spot check it at work- if it is consistently above 140 or 90 please give us a call, we may need to adjust your medications. Work on low salt/sodium diet - goal <1.5gm (1,500mg ) per day. Eat a diet high in fruits/vegetables and whole grains.  Look into mediterranean and DASH diet. Goal activity is 13750min/wk of moderate intensity exercise.  This can be split into 30 minute chunks.  If you are not at this level, you can start with smaller 10-15 min increments and slowly build up activity. Look at www.heart.org for more resources  Try taking medications at bed time- put next to toothbrush.

## 2016-03-16 NOTE — Progress Notes (Signed)
Subjective:    Patient ID: Carl Rice, male    DOB: 08-Sep-1981, 35 y.o.   MRN: [id number]  HPI: Jahdiel Krol is a 35 y.o. male presenting on 03/16/2016 for Hypertension   HPI  Pt presents for BP follow-up. He does report he is having trouble remember to take medications. He forgets in the morning. No chest pain or shortness of breath. No visual changes. No swelling in feet or ankles.  Pt scoring 6/27 on depression screen- feeling stressed. Nothing he can't handle. Does not wish to address. No SI/ HI.   Past Medical History  Diagnosis Date  . Genital herpes     Current Outpatient Prescriptions on File Prior to Visit  Medication Sig  . amLODipine (NORVASC) 2.5 MG tablet Take 1 tablet (2.5 mg total) by mouth daily.  Marland Kitchen lisinopril (PRINIVIL,ZESTRIL) 40 MG tablet Take 1 tablet (40 mg total) by mouth daily.  . valACYclovir (VALTREX) 500 MG tablet Take 1 tablet (500 mg total) by mouth 2 (two) times daily. Take for 5 days when needed.   No current facility-administered medications on file prior to visit.    Review of Systems  Constitutional: Negative for fever and chills.  HENT: Negative.   Respiratory: Negative for chest tightness, shortness of breath and wheezing.   Cardiovascular: Negative for chest pain, palpitations and leg swelling.  Gastrointestinal: Negative for nausea, vomiting and abdominal pain.  Endocrine: Negative.   Genitourinary: Negative for dysuria, urgency, discharge, penile pain and testicular pain.  Musculoskeletal: Negative for back pain, joint swelling and arthralgias.  Skin: Negative.   Neurological: Negative for dizziness, weakness, numbness and headaches.  Psychiatric/Behavioral: Positive for dysphoric mood. Negative for sleep disturbance.   Per HPI unless specifically indicated above     Objective:    BP 140/76 mmHg  Pulse 54  Resp 16  Ht '5\' 9"'$  (1.753 m)  Wt 164 lb (74.39 kg)  BMI 24.21 kg/m2  Wt Readings from Last 3 Encounters:  03/16/16 164 lb  (74.39 kg)  01/17/16 160 lb (72.576 kg)  12/02/15 163 lb (73.936 kg)    Depression screen [facility] 2/9 03/16/2016 08/24/2015  Decreased Interest 0 0  Down, Depressed, Hopeless 1 0  PHQ - 2 Score 1 0  Altered sleeping 0 -  Tired, decreased energy 1 -  Change in appetite 0 -  Feeling bad or failure about yourself  1 -  Trouble concentrating 3 -  Moving slowly or fidgety/restless 0 -  Suicidal thoughts 0 -  PHQ-9 Score 6 -     Physical Exam  Constitutional: He is oriented to person, place, and time. He appears well-developed and well-nourished. No distress.  HENT:  Head: Normocephalic and atraumatic.  Neck: Neck supple. No thyromegaly present.  Cardiovascular: Normal rate, regular rhythm and normal heart sounds.  Exam reveals no gallop and no friction rub.   No murmur heard. Pulmonary/Chest: Effort normal and breath sounds normal. He has no wheezes.  Abdominal: Soft. Bowel sounds are normal. He exhibits no distension. There is no tenderness. There is no rebound.  Musculoskeletal: Normal range of motion. He exhibits no edema or tenderness.  Neurological: He is alert and oriented to person, place, and time. He has normal reflexes.  Skin: Skin is warm and dry. No rash noted. No erythema.  Psychiatric: He has a normal mood and affect. His behavior is normal. Thought content normal.   Results for orders placed or performed in visit on 09/30/12  CBC  Result Value Ref Range  WBC 6.8 3.8-10.6 x10 3/mm 3   RBC 6.30 (H) 4.40-5.90 x10 6/mm 3   HGB 15.4 13.0-18.0 g/dL   HCT 49.3 40.0-52.0 %   MCV 78 (L) 80-100 fL   MCH 24.5 (L) 26.0-34.0 pg   MCHC 31.3 (L) 32.0-36.0 g/dL   RDW 15.9 (H) 11.5-14.5 %   Platelet 213 150-440 x10 3/mm 3  Comprehensive metabolic panel  Result Value Ref Range   Glucose 88 65-99 mg/dL   BUN 19 (H) 7-18 mg/dL   Creatinine 1.44 (H) 0.60-1.30 mg/dL   Sodium 140 136-145 mmol/L   Potassium 4.2 3.5-5.1 mmol/L   Chloride 105 98-107 mmol/L   Co2 32 21-32 mmol/L    Calcium, Total 9.2 8.5-10.1 mg/dL   SGOT(AST) 20 15-37 Unit/L   SGPT (ALT) 25 12-78 U/L   Alkaline Phosphatase 46 (L) 50-136 Unit/L   Albumin 4.3 3.4-5.0 g/dL   Total Protein 8.0 6.4-8.2 g/dL   Bilirubin,Total 1.0 0.2-1.0 mg/dL   Osmolality 281 275-301   Anion Gap 3 (L) 7-16   EGFR (African American) >60    EGFR (Non-African Amer.) >60   Lipase, blood  Result Value Ref Range   Lipase 93 73-393 Unit/L  Urinalysis, Complete  Result Value Ref Range   Color - urine Yellow    Clarity - urine Clear    Glucose,UR Negative 0-75 mg/dL   Bilirubin,UR Negative NEGATIVE   Ketone Negative NEGATIVE   Specific Gravity 1.019 1.003-1.030   Blood Negative NEGATIVE   Ph 8.0 4.5-8.0   Protein Negative NEGATIVE   Nitrite Negative NEGATIVE   Leukocyte Esterase Negative NEGATIVE   RBC,UR NONE SEEN 0-5 /HPF   WBC UR NONE SEEN 0-5 /HPF   Bacteria NONE SEEN NONE SEEN   Squamous Epithelial NONE SEEN   CK total and CKMB (cardiac)  Result Value Ref Range   CK, Total 218 35-232 Unit/L   CK-MB 2.2 0.5-3.6 ng/mL  Troponin I  Result Value Ref Range   Troponin-I < 0.02 ng/mL      Assessment & Plan:   Problem List Items Addressed This Visit      Cardiovascular and Mediastinum   Essential hypertension - Primary    BP controlled on recheck. Encouraged pt to check at home call if consistently elevated > 140/90.  Check CMET. Recheck 3 mos.       Relevant Orders   Comprehensive metabolic panel    Other Visit Diagnoses    Mild hyperlipidemia        Get baseline lipid panel.     Relevant Orders    Lipid Profile    Dysphoric mood        Scoring for mild depression. Feels he is doing well. Does not wish to address at this time.        No orders of the defined types were placed in this encounter.      Follow up plan: Return in about 3 months (around 06/15/2016) for BP check. Marland Kitchen

## 2016-06-15 ENCOUNTER — Ambulatory Visit: Payer: Managed Care, Other (non HMO) | Admitting: Family Medicine

## 2016-10-05 ENCOUNTER — Emergency Department: Payer: Managed Care, Other (non HMO)

## 2016-10-05 ENCOUNTER — Encounter: Payer: Self-pay | Admitting: Emergency Medicine

## 2016-10-05 ENCOUNTER — Emergency Department
Admission: EM | Admit: 2016-10-05 | Discharge: 2016-10-05 | Disposition: A | Discharge status: Home / Self Care | Payer: Managed Care, Other (non HMO) | Attending: Emergency Medicine | Admitting: Emergency Medicine

## 2016-10-05 DIAGNOSIS — M25572 Pain in left ankle and joints of left foot: Secondary | ICD-10-CM

## 2016-10-05 DIAGNOSIS — Y929 Unspecified place or not applicable: Secondary | ICD-10-CM | POA: Insufficient documentation

## 2016-10-05 DIAGNOSIS — Y999 Unspecified external cause status: Secondary | ICD-10-CM | POA: Diagnosis not present

## 2016-10-05 DIAGNOSIS — X58XXXA Exposure to other specified factors, initial encounter: Secondary | ICD-10-CM | POA: Diagnosis not present

## 2016-10-05 DIAGNOSIS — S86002A Unspecified injury of left Achilles tendon, initial encounter: Secondary | ICD-10-CM | POA: Diagnosis not present

## 2016-10-05 DIAGNOSIS — Y939 Activity, unspecified: Secondary | ICD-10-CM | POA: Diagnosis not present

## 2016-10-05 DIAGNOSIS — S99912A Unspecified injury of left ankle, initial encounter: Secondary | ICD-10-CM | POA: Diagnosis present

## 2016-10-05 DIAGNOSIS — Z79899 Other long term (current) drug therapy: Secondary | ICD-10-CM | POA: Diagnosis not present

## 2016-10-05 MED ORDER — KETOROLAC TROMETHAMINE 10 MG PO TABS
10.0000 mg | ORAL_TABLET | Freq: Once | ORAL | Status: AC
  Administered 2016-10-05: 10 mg via ORAL
  Filled 2016-10-05: qty 1

## 2016-10-05 MED ORDER — OXYCODONE-ACETAMINOPHEN 5-325 MG PO TABS: 1.0000 | ORAL_TABLET | ORAL | 0 refills | Status: DC | PRN

## 2016-10-05 NOTE — ED Triage Notes (Signed)
Pt to triage via w/c with no distress noted; st since Monday having pain to left ankle & achilles with no known injury

## 2016-10-05 NOTE — ED Provider Notes (Signed)
[facility] Emergency Department Provider Note  ____________________________________________  Time seen: 3:45 AM  I have reviewed the triage vital signs and the nursing notes.   HISTORY  Chief Complaint Ankle Pain     HPI Carl Rice is a 35 y.o. male presents with nontraumatic left ankle/Achilles pain 3 days. Patient states current pain score is 10 out of 10 worse with movement and ambulation     Past Medical History:  Diagnosis Date  . Genital herpes     Patient Active Problem List   Diagnosis Date Noted  . Health maintenance alteration 08/24/2015  . Genital herpes 08/24/2015  . Essential hypertension 08/24/2015  . Need for influenza vaccination 08/24/2015    No past surgical history on file.  Current Outpatient Rx  . Order #: [id number] Class: Normal  . Order #: [id number] Class: Normal  . Order #: [id number] Class: Normal    Allergies Patient has no known allergies.  Family History  Problem Relation Age of Onset  . Hypertension Mother   . Drug abuse Mother   . Drug abuse Father   . Kidney disease Son     Social History Social History  Substance Use Topics  . Smoking status: Never Smoker  . Smokeless tobacco: Never Used  . Alcohol use 0.0 oz/week    Review of Systems  Constitutional: Negative for fever. Eyes: Negative for visual changes. ENT: Negative for sore throat. Cardiovascular: Negative for chest pain. Respiratory: Negative for shortness of breath. Gastrointestinal: Negative for abdominal pain, vomiting and diarrhea. Genitourinary: Negative for dysuria. Musculoskeletal: Negative for back pain.Positive for left ankle/Achilles tendon Skin: Negative for rash. Neurological: Negative for headaches, focal weakness or numbness.   10-point ROS otherwise negative.  ____________________________________________   PHYSICAL EXAM:  VITAL SIGNS: ED Triage Vitals  Enc Vitals Group     BP 10/05/16 0134 (!) 148/100      Pulse Rate 10/05/16 0134 68     Resp 10/05/16 0134 18     Temp 10/05/16 0134 97.6 F (36.4 C)     Temp Source 10/05/16 0134 Oral     SpO2 10/05/16 0134 98 %     Weight 10/05/16 0133 160 lb (72.6 kg)     Height 10/05/16 0133 5\' 9"  (1.753 m)     Head Circumference --      Peak Flow --      Pain Score 10/05/16 0133 10     Pain Loc --      Pain Edu? --      Excl. in GC? --      Constitutional: Alert and oriented. Well appearing and in no distress. Eyes: Conjunctivae are normal. PERRL. Normal extraocular movements. ENT   Head: Normocephalic and atraumatic.   Nose: No congestion/rhinnorhea.   Mouth/Throat: Mucous membranes are moist.   Neck: No stridor. Musculoskeletal: Pain with palpation distal Achilles tendon insertion. Pain worsened with dorsiflexion of the foot. Neurologic:  Normal speech and language. No gross focal neurologic deficits are appreciated. Speech is normal.  Skin:  Skin is warm, dry and intact. No rash noted. Psychiatric: Mood and affect are normal. Speech and behavior are normal. Patient exhibits appropriate insight and judgment.      RADIOLOGY  CLINICAL DATA:  Acute onset of left ankle pain.  Initial encounter.  EXAM: LEFT ANKLE COMPLETE - 3+ VIEW  COMPARISON:  None.  FINDINGS: There is no evidence of fracture or dislocation. The ankle mortise is intact; the interosseous space is within normal limits. No talar tilt or subluxation  is seen.  The joint spaces are preserved.  An ankle joint effusion is noted.  IMPRESSION: 1. No evidence of fracture or dislocation. 2. Ankle joint effusion noted.   Electronically Signed   By: Roanna RaiderJeffery  Chang M.D.   On: 10/05/2016 03:57    .Splint Application Date/Time: 10/05/2016 4:34 AM Performed by: Darci CurrentBROWN, [place] N Authorized by: Darci CurrentBROWN, [place] N   Consent:    Consent obtained:  Verbal   Consent given by:  Patient   Risks discussed:  Pain Pre-procedure details:    Sensation:   Normal Procedure details:    Laterality:  Left   Location:  Ankle   Ankle:  L ankle   Strapping: no     Splint type:  Ankle stirrup   Supplies:  Plaster Post-procedure details:    Pain:  Unchanged   Sensation:  Normal   Patient tolerance of procedure:  Tolerated well, no immediate complications      INITIAL IMPRESSION / ASSESSMENT AND PLAN / ED COURSE  Pertinent labs & imaging results that were available during my care of the patient were reviewed by me and considered in my medical decision making (see chart for details).  History physical exam concerning for possible Achilles tendon injury as such patient will be referred to orthopedic surgeon Dr. Truett PernaMentz.  ____________________________________________   FINAL CLINICAL IMPRESSION(S) / ED DIAGNOSES  Final diagnoses:  Acute left ankle pain  Achilles tendon injury, left, initial encounter      Darci Currentandolph N Dawnna Gritz, MD 10/05/16 [phone]

## 2017-07-17 ENCOUNTER — Other Ambulatory Visit: Payer: Self-pay | Admitting: Family Medicine

## 2017-07-17 ENCOUNTER — Ambulatory Visit (INDEPENDENT_AMBULATORY_CARE_PROVIDER_SITE_OTHER): Payer: Commercial Managed Care - PPO | Admitting: Family Medicine

## 2017-07-17 ENCOUNTER — Encounter: Payer: Self-pay | Admitting: Family Medicine

## 2017-07-17 VITALS — BP 142/98 | HR 58 | Temp 98.2°F | Resp 16 | Ht 69.0 in | Wt 155.8 lb

## 2017-07-17 DIAGNOSIS — A6002 Herpesviral infection of other male genital organs: Secondary | ICD-10-CM

## 2017-07-17 DIAGNOSIS — I10 Essential (primary) hypertension: Secondary | ICD-10-CM | POA: Diagnosis not present

## 2017-07-17 DIAGNOSIS — Z114 Encounter for screening for human immunodeficiency virus [HIV]: Secondary | ICD-10-CM

## 2017-07-17 DIAGNOSIS — R7989 Other specified abnormal findings of blood chemistry: Secondary | ICD-10-CM

## 2017-07-17 DIAGNOSIS — Z0000 Encounter for general adult medical examination without abnormal findings: Secondary | ICD-10-CM

## 2017-07-17 DIAGNOSIS — R799 Abnormal finding of blood chemistry, unspecified: Secondary | ICD-10-CM

## 2017-07-17 MED ORDER — VALACYCLOVIR HCL 500 MG PO TABS
500.0000 mg | ORAL_TABLET | Freq: Two times a day (BID) | ORAL | 12 refills | Status: DC
Start: 2017-07-17 — End: 2018-12-04

## 2017-07-17 MED ORDER — AMLODIPINE BESYLATE 5 MG PO TABS: 5.0000 mg | ORAL_TABLET | Freq: Every day | ORAL | 7 refills | Status: DC

## 2017-07-17 NOTE — Progress Notes (Signed)
Subjective:    Patient ID: Carl Rice, male    DOB: 1981/09/23, 36 y.o.   MRN: [id number]  Carl Rice is a 36 y.o. male presenting on 07/17/2017 for Hypertension  Previously followed by PCP Dr Luan Pulling here at [facility], has not been back to follow-up in >1 year.  HPI   CHRONIC HTN: Reports initial diagnosis 2 years ago with initial dx HTN. Recently had health screening at work with some BP checks, ranging 138 to 148. Current Meds - None (had been on Amlodipine 2.'5mg'$ , Lisinopril '40mg'$  daily) - Admits allergy with cough reaction to Lisinopril, has been off both meds for >6-12 months Lifestyle: - Diet: Improved diet, now not eating pork, and limited bacon and salty food intake, trying to lower sodium content of foods, drinks mostly water, some juice. - Previously was drinking larger amount alcohol (was drinking daily for long time had problems with affecting his stomach), now reduced amount, currently reduced to few days a week and weekend, few drinks only beer/liquor some wine. - Exercise: Resumed regular exercise at gym, every day, weight training and cardio with running/basketball - Family history of HTN (both sides of family) - Admits other factors with life stressors increasing BP - Denies CP, dyspnea, HA, edema, dizziness / lightheadedness  HSV2, Genital Herpes - Previously on Valtrex PRN flare, may flare every few months. Requests refill.  Health Maintenance: - Due routine HIV screen  Social History  Substance Use Topics  . Smoking status: Never Smoker  . Smokeless tobacco: Never Used  . Alcohol use 0.0 oz/week    Review of Systems Per HPI unless specifically indicated above     Objective:    BP (!) 142/98 (BP Location: Right Arm, Cuff Size: Normal)   Pulse (!) 58   Temp 98.2 F (36.8 C) (Oral)   Resp 16   Ht '5\' 9"'$  (1.753 m)   Wt 155 lb 12.8 oz (70.7 kg)   BMI 23.01 kg/m   Wt Readings from Last 3 Encounters:  07/17/17 155 lb 12.8 oz (70.7 kg)  10/05/16 160 lb  (72.6 kg)  03/16/16 164 lb (74.4 kg)    Physical Exam  Constitutional: He is oriented to person, place, and time. He appears well-developed and well-nourished. No distress.  Well-appearing, comfortable, cooperative  HENT:  Head: Normocephalic and atraumatic.  Mouth/Throat: Oropharynx is clear and moist.  Eyes: Conjunctivae are normal. Right eye exhibits no discharge. Left eye exhibits no discharge.  Neck: Normal range of motion. Neck supple. No thyromegaly present.  Cardiovascular: Normal rate, regular rhythm, normal heart sounds and intact distal pulses.   No murmur heard. Pulmonary/Chest: Effort normal and breath sounds normal. No respiratory distress. He has no wheezes. He has no rales.  Musculoskeletal: Normal range of motion. He exhibits no edema.  Lymphadenopathy:    He has no cervical adenopathy.  Neurological: He is alert and oriented to person, place, and time.  Skin: Skin is warm and dry. No rash noted. He is not diaphoretic. No erythema.  Dry skin on hands  Psychiatric: He has a normal mood and affect. His behavior is normal.  Well groomed, good eye contact, normal speech and thoughts  Nursing note and vitals reviewed.    I have personally reviewed the following lab results from 09/2012.  Results for orders placed or performed in visit on 09/30/12  CBC  Result Value Ref Range   WBC 6.8 3.8 - 10.6 x10 3/mm 3   RBC 6.30 (H) 4.40 - 5.90 x10 6/mm  3   HGB 15.4 13.0 - 18.0 g/dL   HCT 49.3 40.0 - 52.0 %   MCV 78 (L) 80 - 100 fL   MCH 24.5 (L) 26.0 - 34.0 pg   MCHC 31.3 (L) 32.0 - 36.0 g/dL   RDW 15.9 (H) 11.5 - 14.5 %   Platelet 213 150 - 440 x10 3/mm 3  Comprehensive metabolic panel  Result Value Ref Range   Glucose 88 65 - 99 mg/dL   BUN 19 (H) 7 - 18 mg/dL   Creatinine 1.44 (H) 0.60 - 1.30 mg/dL   Sodium 140 136 - 145 mmol/L   Potassium 4.2 3.5 - 5.1 mmol/L   Chloride 105 98 - 107 mmol/L   Co2 32 21 - 32 mmol/L   Calcium, Total 9.2 8.5 - 10.1 mg/dL   SGOT(AST)  20 15 - 37 Unit/L   SGPT (ALT) 25 12 - 78 U/L   Alkaline Phosphatase 46 (L) 50 - 136 Unit/L   Albumin 4.3 3.4 - 5.0 g/dL   Total Protein 8.0 6.4 - 8.2 g/dL   Bilirubin,Total 1.0 0.2 - 1.0 mg/dL   Osmolality 281 275 - 301   Anion Gap 3 (L) 7 - 16   EGFR (African American) >60    EGFR (Non-African Amer.) >60   Lipase, blood  Result Value Ref Range   Lipase 93 73 - 393 Unit/L  Urinalysis, Complete  Result Value Ref Range   Color - urine Yellow    Clarity - urine Clear    Glucose,UR Negative 0 - 75 mg/dL   Bilirubin,UR Negative NEGATIVE   Ketone Negative NEGATIVE   Specific Gravity 1.019 1.003 - 1.030   Blood Negative NEGATIVE   Ph 8.0 4.5 - 8.0   Protein Negative NEGATIVE   Nitrite Negative NEGATIVE   Leukocyte Esterase Negative NEGATIVE   RBC,UR NONE SEEN 0 - 5 /HPF   WBC UR NONE SEEN 0 - 5 /HPF   Bacteria NONE SEEN NONE SEEN   Squamous Epithelial NONE SEEN   CK total and CKMB (cardiac)  Result Value Ref Range   CK, Total 218 35 - 232 Unit/L   CK-MB 2.2 0.5 - 3.6 ng/mL  Troponin I  Result Value Ref Range   Troponin-I < 0.02 ng/mL      Assessment & Plan:   Problem List Items Addressed This Visit    Genital herpes    Refilled Valtrex, continue PRN flares      Relevant Medications   valACYclovir (VALTREX) 500 MG tablet   Essential hypertension - Primary    Uncontrolled HTN,  Manual repeat not improved significantly - Outside BP readings elevated No known complications - but prior history of elevated Creatinine in 2013 - ACEi intolerance cough - Concern with history of alcohol consumption other factor inc BP  Plan:  1. Restart Amlodipine - now inc to '5mg'$  daily 2. Encourage improved lifestyle - low sodium diet, regular exercise 3. Start monitor BP outside office, bring readings to next visit, if persistently >140/90 or new symptoms notify office sooner 4. Follow-up with labs this week then follow-up 6 months if BP improved, otherwise return sooner q 3 month       Relevant Medications   amLODipine (NORVASC) 5 MG tablet      Meds ordered this encounter  Medications  . amLODipine (NORVASC) 5 MG tablet    Sig: Take 1 tablet (5 mg total) by mouth daily.    Dispense:  30 tablet    Refill:  7  . valACYclovir (VALTREX) 500 MG tablet    Sig: Take 1 tablet (500 mg total) by mouth 2 (two) times daily. Take for 5 days when needed.    Dispense:  10 tablet    Refill:  12    Follow up plan: Return in about 6 months (around 01/17/2018) for HTN.  Labs ordered for later this week, fasting.  Nobie Putnam, [facility] 07/17/2017, 10:04 PM

## 2017-07-17 NOTE — Patient Instructions (Addendum)
Thank you for coming to the clinic today.  1. Restarted Amlodipine blood pressure - increased to 5mg  once daily in morning - Keep track of blood pressure regularly if possible, 1-3 x week for next 1-2 weeks, goal BP < 140/90, ideally less than 135/85 - Continue with your diet and exercise plan. Low sodium will certainly help - Caution with alcohol, it can continue to raise BP, consider reducing this in future  Return sooner such as 3 months if not improved BP  2. Refilled Valtrex as needed  DUE for FASTING BLOOD WORK (no food or drink after midnight before the lab appointment, only water or coffee without cream/sugar on the morning of)  SCHEDULE "Lab Only" visit in the morning at the clinic for lab draw in later this week (within 1 week)  - Make sure Lab Only appointment is at about 1 week before your next appointment, so that results will be available  For Lab Results, once available within 2-3 days of blood draw, you can can log in to MyChart online to view your results and a brief explanation. Also, we can discuss results at next follow-up visit.  Please schedule a Follow-up Appointment to: Return in about 6 months (around 01/17/2018) for HTN.  If you have any other questions or concerns, please feel free to call the clinic or send a message through MyChart. You may also schedule an earlier appointment if necessary.  Additionally, you may be receiving a survey about your experience at our clinic within a few days to 1 week by e-mail or mail. We value your feedback.  Saralyn Pilar, DO [facility], [place]

## 2017-07-17 NOTE — Assessment & Plan Note (Addendum)
Uncontrolled HTN,  Manual repeat not improved significantly - Outside BP readings elevated No known complications - but prior history of elevated Creatinine in 2013 - ACEi intolerance cough - Concern with history of alcohol consumption other factor inc BP  Plan:  1. Restart Amlodipine - now inc to 5mg  daily 2. Encourage improved lifestyle - low sodium diet, regular exercise 3. Start monitor BP outside office, bring readings to next visit, if persistently >140/90 or new symptoms notify office sooner 4. Follow-up with labs this week then follow-up 6 months if BP improved, otherwise return sooner q 3 month

## 2017-07-17 NOTE — Assessment & Plan Note (Signed)
Refilled Valtrex, continue PRN flares

## 2017-07-20 ENCOUNTER — Other Ambulatory Visit: Payer: Commercial Managed Care - PPO

## 2017-11-25 ENCOUNTER — Emergency Department
Admission: EM | Admit: 2017-11-25 | Discharge: 2017-11-25 | Disposition: A | Discharge status: Home / Self Care | Payer: Managed Care, Other (non HMO) | Attending: Emergency Medicine | Admitting: Emergency Medicine

## 2017-11-25 ENCOUNTER — Other Ambulatory Visit: Payer: Self-pay

## 2017-11-25 ENCOUNTER — Encounter: Payer: Self-pay | Admitting: Emergency Medicine

## 2017-11-25 DIAGNOSIS — I10 Essential (primary) hypertension: Secondary | ICD-10-CM | POA: Insufficient documentation

## 2017-11-25 DIAGNOSIS — H1031 Unspecified acute conjunctivitis, right eye: Secondary | ICD-10-CM | POA: Insufficient documentation

## 2017-11-25 DIAGNOSIS — Z79899 Other long term (current) drug therapy: Secondary | ICD-10-CM | POA: Insufficient documentation

## 2017-11-25 MED ORDER — ERYTHROMYCIN 5 MG/GM OP OINT: TOPICAL_OINTMENT | Freq: Three times a day (TID) | OPHTHALMIC | 0 refills | Status: AC

## 2017-11-25 NOTE — ED Notes (Addendum)
Pt reports watery, itchy eyes yesterday and awakened this am to right eye matted closed  Redness present to right eye   Denies vision changes

## 2017-11-25 NOTE — ED Provider Notes (Signed)
[facility] Emergency Department Provider Note  ____________________________________________  Time seen: Approximately 11:02 AM  I have reviewed the triage vital signs and the nursing notes.   HISTORY  Chief Complaint Conjunctivitis    HPI Carl Rice is a 37 y.o. male that presents to the emergency department for evaluation of right eye redness, tearing, drainage for 1 day.  Patient woke up this morning and could not open his eye because it was "glued shut."He has had pinkeye before and this feels the same.  No recent illness.  No fever, visual changes, floaters, flashes, nausea, vomiting.  Past Medical History:  Diagnosis Date  . Genital herpes     Patient Active Problem List   Diagnosis Date Noted  . Genital herpes 08/24/2015  . Essential hypertension 08/24/2015    History reviewed. No pertinent surgical history.  Prior to Admission medications   Medication Sig Start Date End Date Taking? Authorizing Provider  amLODipine (NORVASC) 5 MG tablet Take 1 tablet (5 mg total) by mouth daily. 07/17/17   Karamalegos, Netta Neat, DO  erythromycin [facility]) ophthalmic ointment Place into the left eye 3 (three) times daily for 10 days. Place a 1/2 inch ribbon of ointment into the lower eyelid. 11/25/17 12/05/17  Enid Derry, PA-C  valACYclovir (VALTREX) 500 MG tablet Take 1 tablet (500 mg total) by mouth 2 (two) times daily. Take for 5 days when needed. 07/17/17   Smitty Cords, DO    Allergies Lisinopril  Family History  Problem Relation Age of Onset  . Hypertension Mother   . Drug abuse Mother   . Drug abuse Father   . Hypertension Father   . Kidney disease Son     Social History Social History   Tobacco Use  . Smoking status: Never Smoker  . Smokeless tobacco: Never Used  Substance Use Topics  . Alcohol use: Yes    Alcohol/week: 0.0 oz  . Drug use: No     Review of Systems  Constitutional: No fever/chills Cardiovascular: No  chest pain. Respiratory: No SOB. Gastrointestinal: No nausea, no vomiting.  Musculoskeletal: Negative for musculoskeletal pain. Skin: Negative for rash, abrasions, lacerations, ecchymosis. Neurological: Negative for headaches   ____________________________________________   PHYSICAL EXAM:  VITAL SIGNS: ED Triage Vitals  Enc Vitals Group     BP 11/25/17 1014 (!) 157/107     Pulse Rate 11/25/17 1014 68     Resp 11/25/17 1014 18     Temp 11/25/17 1014 98.1 F (36.7 C)     Temp Source 11/25/17 1014 Oral     SpO2 11/25/17 1014 100 %     Weight --      Height --      Head Circumference --      Peak Flow --      Pain Score 11/25/17 1012 3     Pain Loc --      Pain Edu? --      Excl. in GC? --      Constitutional: Alert and oriented. Well appearing and in no acute distress. Eyes: Right conjunctiva is injected with yellow crusting to eyelashes and watery drainage.  PERRL. EOMI. No tenderness to palpation or swelling of orbits. Head: Atraumatic. ENT:      Ears:      Nose: No congestion/rhinnorhea.      Mouth/Throat: Mucous membranes are moist.  Neck: No stridor.   Cardiovascular: Good peripheral circulation. Respiratory: Normal respiratory effort without tachypnea or retractions. Good air entry to the bases  with no decreased or absent breath sounds. Musculoskeletal: Full range of motion to all extremities. No gross deformities appreciated. Neurologic:  Normal speech and language. No gross focal neurologic deficits are appreciated.  Skin:  Skin is warm, dry and intact. No rash noted. Psychiatric: Mood and affect are normal. Speech and behavior are normal. Patient exhibits appropriate insight and judgement.   ____________________________________________   LABS (all labs ordered are listed, but only abnormal results are displayed)  Labs Reviewed - No data to  display ____________________________________________  EKG   ____________________________________________  RADIOLOGY  No results found.  ____________________________________________    PROCEDURES  Procedure(s) performed:    Procedures    Medications - No data to display   ____________________________________________   INITIAL IMPRESSION / ASSESSMENT AND PLAN / ED COURSE  Pertinent labs & imaging results that were available during my care of the patient were reviewed by me and considered in my medical decision making (see chart for details).  Review of the [place] CSRS was performed in accordance of the [facility] prior to dispensing any controlled drugs.   Patient's diagnosis is consistent with conjunctivitis.  Vital signs and exam are reassuring.  Patient will be discharged home with prescriptions for erythromycin ointment. Patient is to follow up with opthamology as directed. Patient is given ED precautions to return to the ED for any worsening or new symptoms.     ____________________________________________  FINAL CLINICAL IMPRESSION(S) / ED DIAGNOSES  Final diagnoses:  Acute bacterial conjunctivitis of right eye      NEW MEDICATIONS STARTED DURING THIS VISIT:  ED Discharge Orders        Ordered    erythromycin [facility](ROMYCIN) ophthalmic ointment  3 times daily     11/25/17 1121          This chart was dictated using voice recognition software/Dragon. Despite best efforts to proofread, errors can occur which can change the meaning. Any change was purely unintentional.    Enid DerryWagner, Selah Klang, PA-C 11/25/17 1154    Governor RooksLord, Rebecca, MD 11/25/17 1344

## 2017-11-25 NOTE — ED Triage Notes (Signed)
Pt reports right eye excessive tearing, feels like sand in eye, and eye matted shut this morning. Sxs started yesterday.

## 2017-12-11 IMAGING — DX DG ANKLE COMPLETE 3+V*L*
3 series · 3 of 3 positions shown · non-contrast
Comparison: None.

CLINICAL DATA: Acute onset of left ankle pain.  Initial encounter.

EXAM:
LEFT ANKLE COMPLETE - 3+ VIEW

[ankle ap]
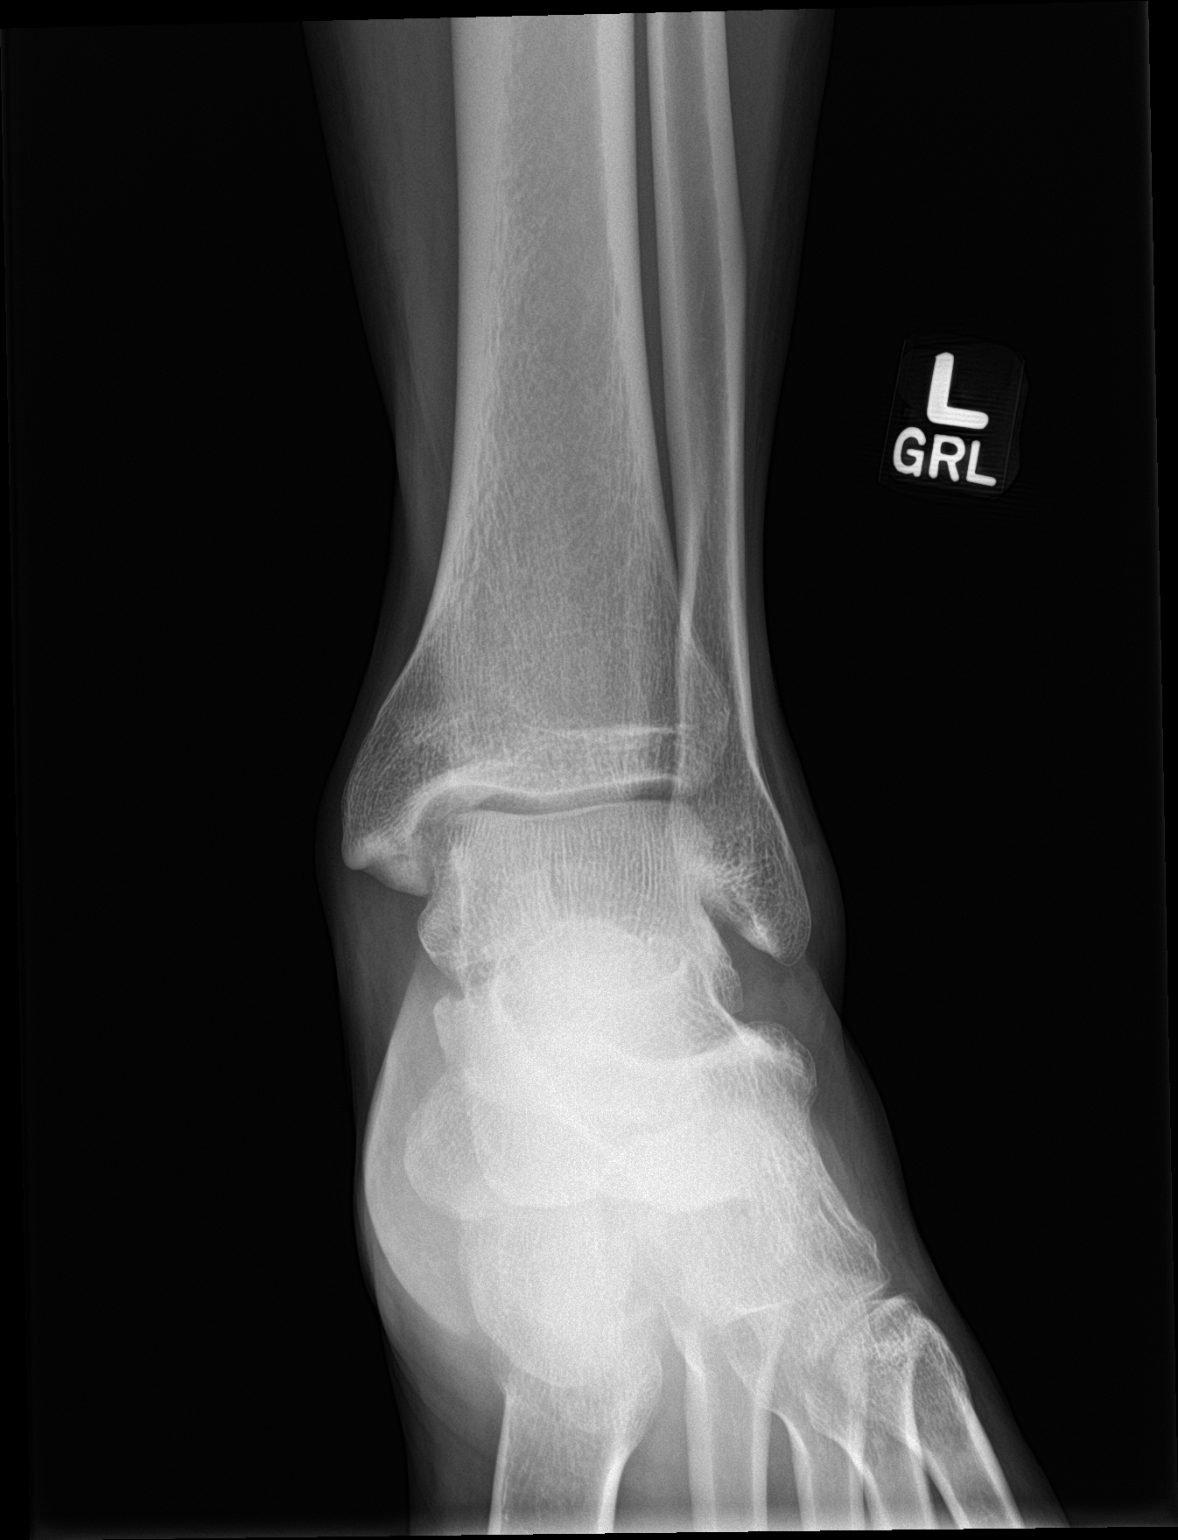

[ankle obl]
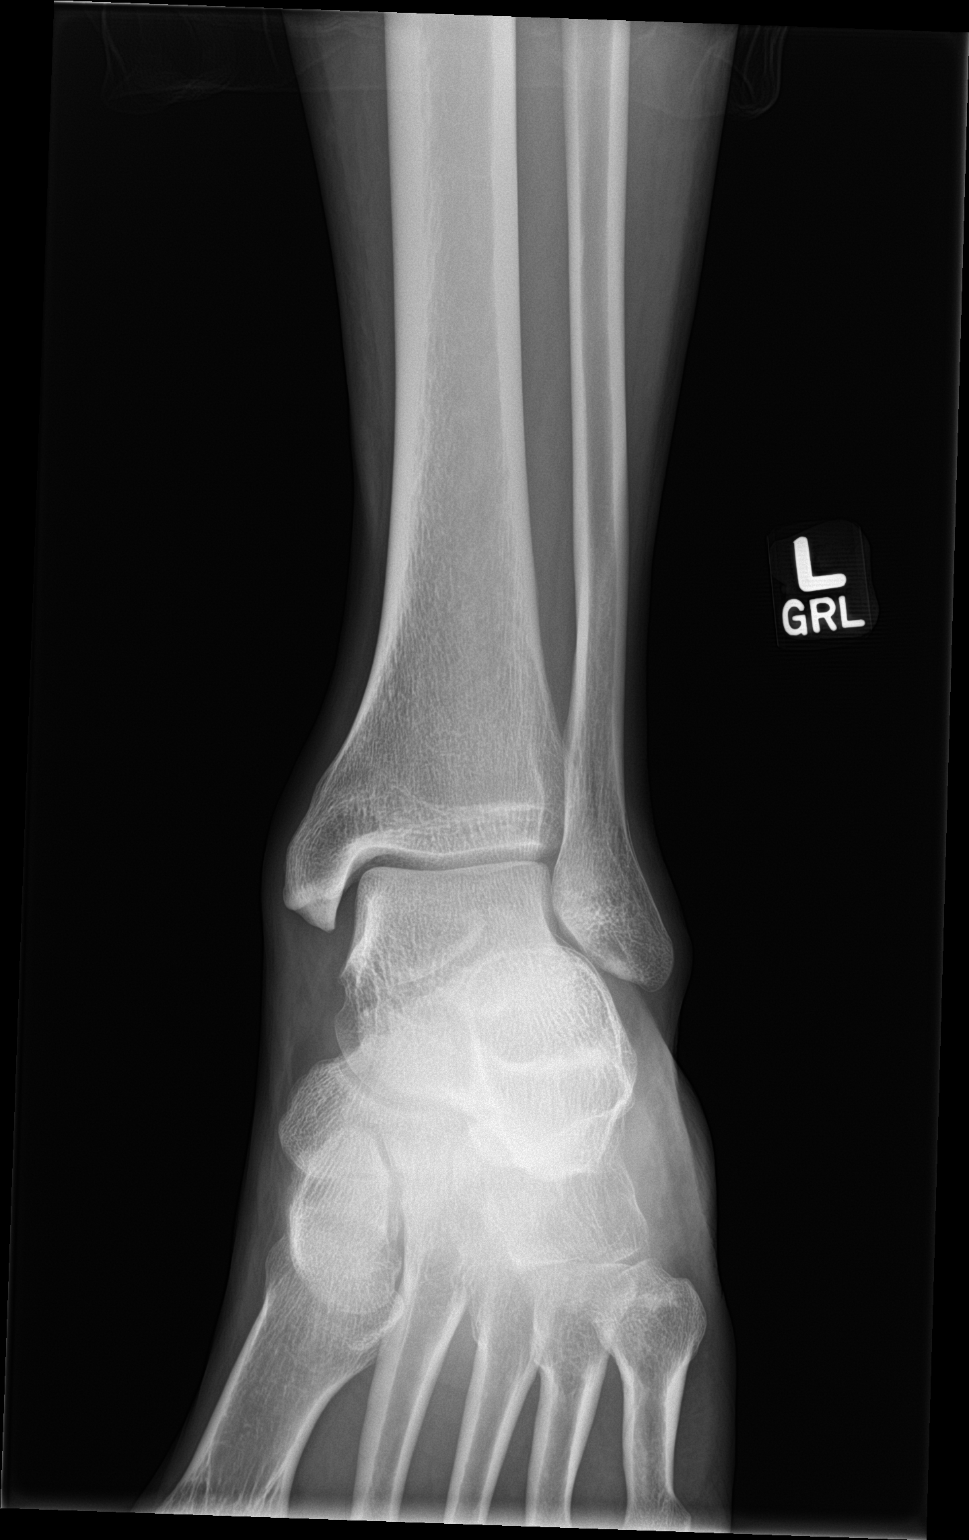

[ankle lat]
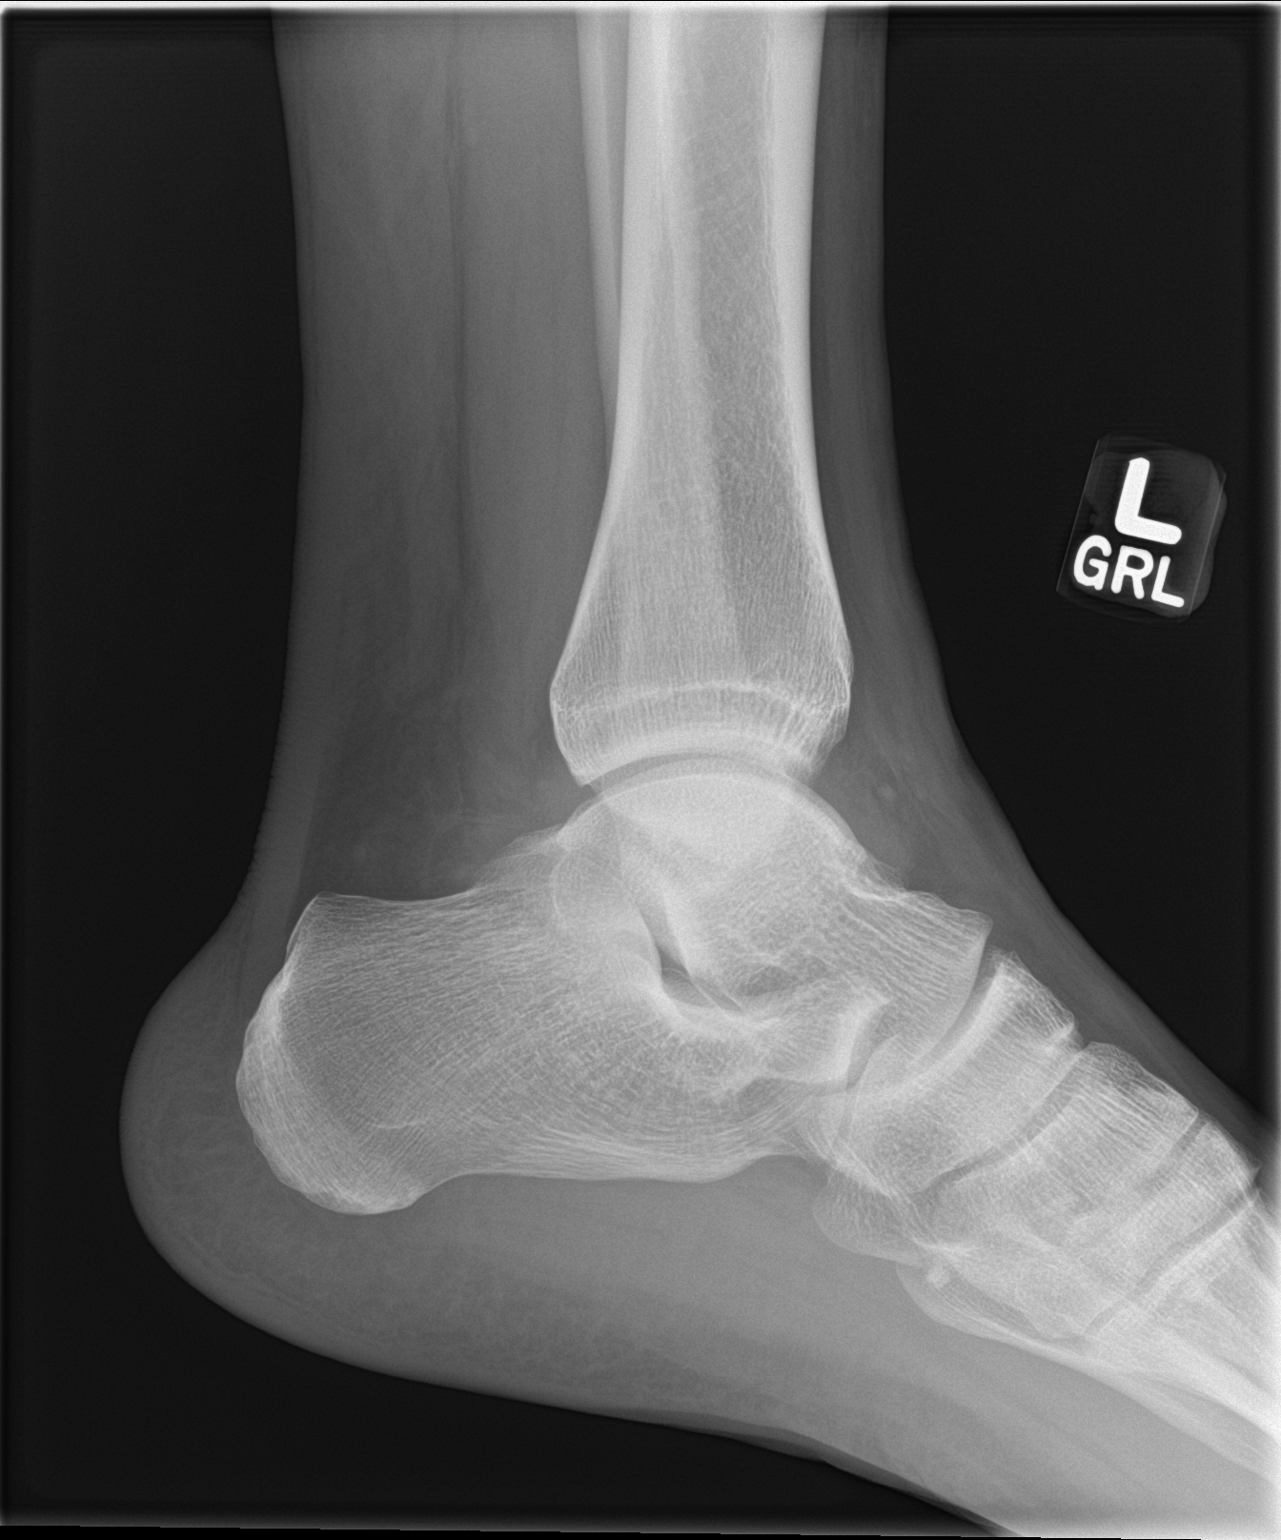

[3 of 3 positions shown; findings below may reference images not displayed]

FINDINGS: There is no evidence of fracture or dislocation. The ankle mortise
is intact; the interosseous space is within normal limits. No talar
tilt or subluxation is seen.

The joint spaces are preserved.  An ankle joint effusion is noted.
IMPRESSION: 1. No evidence of fracture or dislocation.
2. Ankle joint effusion noted.

## 2018-01-18 ENCOUNTER — Ambulatory Visit: Payer: Commercial Managed Care - PPO | Admitting: Family Medicine

## 2018-12-04 ENCOUNTER — Encounter: Payer: Self-pay | Admitting: Family Medicine

## 2018-12-04 ENCOUNTER — Ambulatory Visit (INDEPENDENT_AMBULATORY_CARE_PROVIDER_SITE_OTHER): Payer: Commercial Managed Care - PPO | Admitting: Family Medicine

## 2018-12-04 VITALS — BP 146/108 | HR 63 | Temp 98.1°F | Resp 16 | Ht 70.0 in | Wt 166.8 lb

## 2018-12-04 DIAGNOSIS — A6002 Herpesviral infection of other male genital organs: Secondary | ICD-10-CM

## 2018-12-04 DIAGNOSIS — R1012 Left upper quadrant pain: Secondary | ICD-10-CM

## 2018-12-04 DIAGNOSIS — F1021 Alcohol dependence, in remission: Secondary | ICD-10-CM | POA: Insufficient documentation

## 2018-12-04 DIAGNOSIS — K219 Gastro-esophageal reflux disease without esophagitis: Secondary | ICD-10-CM | POA: Diagnosis not present

## 2018-12-04 DIAGNOSIS — F1020 Alcohol dependence, uncomplicated: Secondary | ICD-10-CM | POA: Insufficient documentation

## 2018-12-04 DIAGNOSIS — G8929 Other chronic pain: Secondary | ICD-10-CM

## 2018-12-04 DIAGNOSIS — R252 Cramp and spasm: Secondary | ICD-10-CM

## 2018-12-04 DIAGNOSIS — I10 Essential (primary) hypertension: Secondary | ICD-10-CM | POA: Diagnosis not present

## 2018-12-04 MED ORDER — OMEPRAZOLE 40 MG PO CPDR: 40.0000 mg | DELAYED_RELEASE_CAPSULE | Freq: Every day | ORAL | 2 refills | Status: DC

## 2018-12-04 MED ORDER — SUCRALFATE 1 G PO TABS: 1.0000 g | ORAL_TABLET | Freq: Three times a day (TID) | ORAL | 1 refills | Status: DC

## 2018-12-04 MED ORDER — VALACYCLOVIR HCL 500 MG PO TABS: 500.0000 mg | ORAL_TABLET | Freq: Two times a day (BID) | ORAL | 5 refills | Status: DC

## 2018-12-04 NOTE — Progress Notes (Signed)
Subjective:    Patient ID: Carl Rice, male    DOB: 09/27/1981, 38 y.o.   MRN: [id number]  Carl Rice is a 38 y.o. male presenting on 12/04/2018 for Abdominal Pain (left side patient concerned about drinking alcohol and decided to limit alcohol intake but still has stomach cramp also in calf muscle onset 4 month)   HPI   GERD vs PUD, Possible Alcoholic Gastritis / Alcohol Dependence early remission - Reports persistent problem with left sided upper abdominal pain for past 2 years, he states in past he has had issues related to drinking with larger amount every day drinking for several years, he would drink approx 2+ beers and shot. Most recently about 2 months ago reduced alcohol intake to Truli low carb alcoholic drink 1-2 a day, can go without for 1-2 days at a time  He states the pain and symptoms affected by alcohol intake. He describes sharp episodic burning pain LUQ abdominal pain, in past while drinking more he had more sharp pains across abdomen but these have improved. - Does not seem to be related to eating - Not tried any antacid or PPI OTC. He does not take regular NSAID - Admits occasional dark stool - Admits previous history of GERD symptoms worse heartburn while drinking more - Denies any rectal bleeding blood in stool, nausea, vomiting  Lower Extremity Cramping, Calves Reports lower extremity muscle cramps as well related to prolonged standing, with some radiating pain.  CHRONIC HTN: Reports some elevated BP in past he stopped amlodipine due to side effect. Current Meds - None   Lifestyle: - Diet: no caffeine, occasionally salty foods - Exercise: regular exercise often Denies CP, dyspnea, HA, edema, dizziness / lightheadedness  Health Maintenance: UTD with Flu Vaccine 09/2018  Dep[facility] screen [facility] 2[facility] 12/04/2018 07/17/2017 03/16/2016  Decreased Interest 0 0 0  Down, Depressed, Hopeless 0 0 1  PHQ - 2 Score 0 0 1  Altered sleeping - - 0  Tired, decreased energy -  - 1  Change in appetite - - 0  Feeling bad or failure about yourself  - - 1  Trouble concentrating - - 3  Moving slowly or fidgety/restless - - 0  Suicidal thoughts - - 0  PHQ-9 Score - - 6    Social History   Tobacco Use  . Smoking status: Never Smoker  . Smokeless tobacco: Never Used  Substance Use Topics  . Alcohol use: Yes    Alcohol/week: 0.0 standard drinks  . Drug use: No    Review of Systems Per HPI unless specifically indicated above     Objective:    BP (!) 146/108   Pulse 63   Temp 98.1 F (36.7 C) (Oral)   Resp 16   Ht 5\' 10"  (1.778 m)   Wt 166 lb 12.8 oz (75.7 kg)   BMI 23.93 kg/m   Wt Readings from Last 3 Encounters:  12/04/18 166 lb 12.8 oz (75.7 kg)  07/17/17 155 lb 12.8 oz (70.7 kg)  10/05/16 160 lb (72.6 kg)    Physical Exam Vitals signs and nursing note reviewed.  Constitutional:      General: He is not in acute distress.    Appearance: He is well-developed. He is not diaphoretic.     Comments: Well-appearing, comfortable, cooperative  HENT:     Head: Normocephalic and atraumatic.  Eyes:     General:        Right eye: No discharge.  Left eye: No discharge.     Conjunctiva/sclera: Conjunctivae normal.  Neck:     Musculoskeletal: Normal range of motion and neck supple.     Thyroid: No thyromegaly.  Cardiovascular:     Rate and Rhythm: Normal rate and regular rhythm.     Heart sounds: Normal heart sounds. No murmur.  Pulmonary:     Effort: Pulmonary effort is normal. No respiratory distress.     Breath sounds: Normal breath sounds. No wheezing or rales.  Abdominal:     General: Bowel sounds are increased.     Tenderness: There is abdominal tenderness in the left upper quadrant. There is no right CVA tenderness, left CVA tenderness, guarding or rebound. Negative signs include Murphy's sign, Rovsing's sign and McBurney's sign.  Musculoskeletal: Normal range of motion.  Lymphadenopathy:     Cervical: No cervical adenopathy.   Skin:    General: Skin is warm and dry.     Findings: No erythema or rash.  Neurological:     Mental Status: He is alert and oriented to person, place, and time.  Psychiatric:        Behavior: Behavior normal.     Comments: Well groomed, good eye contact, normal speech and thoughts    Results for orders placed or performed in visit on 12/04/18  CBC with Differential/Platelet  Result Value Ref Range   WBC 4.1 3.8 - 10.8 Thousand/uL   RBC 5.81 (H) 4.20 - 5.80 Million/uL   Hemoglobin 14.4 13.2 - 17.1 g/dL   HCT 16.144.1 09.638.5 - 04.550.0 %   MCV 75.9 (L) 80.0 - 100.0 fL   MCH 24.8 (L) 27.0 - 33.0 pg   MCHC 32.7 32.0 - 36.0 g/dL   RDW 40.915.0 81.111.0 - 91.415.0 %   Platelets 223 140 - 400 Thousand/uL   MPV 12.2 7.5 - 12.5 fL   Neutro Abs 1,943 1,500 - 7,800 cells/uL   Lymphs Abs 1,800 850 - 3,900 cells/uL   Absolute Monocytes 308 200 - 950 cells/uL   Eosinophils Absolute 29 15 - 500 cells/uL   Basophils Absolute 21 0 - 200 cells/uL   Neutrophils Relative % 47.4 %   Total Lymphocyte 43.9 %   Monocytes Relative 7.5 %   Eosinophils Relative 0.7 %   Basophils Relative 0.5 %      Assessment & Plan:   Problem List Items Addressed This Visit    Alcohol dependence in early full remission (HCC) Reduced amount of alcohol nearly daily now 1-2 drink only Prior heavy drinking history, seems to have had dependence, limited physiologic dependence without complicated history of withdrawals  Counseling on future goal alcohol cessation    Relevant Orders   CBC with Differential/Platelet (Completed)   COMPLETE METABOLIC PANEL WITH GFR   Lipase   Essential hypertension Elevated BP now, prior history of improved BP No home readings Alcohol use has reduced but still present Has self discontinued meds  Check outside BP readings, record and follow-up may resume med at future visit    Relevant Orders   COMPLETE METABOLIC PANEL WITH GFR   Gastroesophageal reflux disease - Primary   Relevant Medications    omeprazole (PRILOSEC) 40 MG capsule   sucralfate (CARAFATE) 1 g tablet   Other Relevant Orders   CBC with Differential/Platelet (Completed)   COMPLETE METABOLIC PANEL WITH GFR   H. pylori breath test   Genital herpes Re order Valtrex   Relevant Medications   valACYclovir (VALTREX) 500 MG tablet    Other Visit Diagnoses  Chronic LUQ pain       Relevant Medications   omeprazole (PRILOSEC) 40 MG capsule   sucralfate (CARAFATE) 1 g tablet   Other Relevant Orders   COMPLETE METABOLIC PANEL WITH GFR   H. pylori breath test   Lipase   Muscle cramping    Clinically likely reduced hydration, also regular alcohol consumption can be nutritional deficiency, possibly low K/Mag - will check labs    Relevant Orders   Magnesium      #GI / GERD / PUD Suspect flare up of prior known PUD likely related to alcohol gastritis Not on NSAID Differential dx includes Pancreatitis alcohol induced. Not consistent with gallbladder given not triggered and colick No other significant GI red flags (no unintentional wt loss, night-sweats, refractory abdominal pain n/v, hematemesis or melena).  Plan: Check labs CMET CBC Lipase and H Pylori Breath  1. Start prolonged PPI trial with Protonix 40mg  daily for 4 weeks, anticipate will need up to total 8 weeks therapy given NSAID induced likely ulcer 2. Today check H pylori breath test (given in office), confirmed off PPI >2 weeks now - if positive, will increase PPI to BID dosing, add on triple therapy antibiotics with amoxicillin (1g BID) and clarithromycin (500mg  BID) for 2 weeks 3. Start carafate 1g TID WC + QHS PRN for symptom relief 4. Strict return criteria given for worsening symptoms 5. Follow-up within 1-2 weeks, if no improvement, will refer to GI for further evaluation, may need future EGD given chronicity of problem. If gradual improvement can f/u in office within 4 weeks   Meds ordered this encounter  Medications  . omeprazole (PRILOSEC) 40 MG  capsule    Sig: Take 1 capsule (40 mg total) by mouth daily.    Dispense:  30 capsule    Refill:  2  . sucralfate (CARAFATE) 1 g tablet    Sig: Take 1 tablet (1 g total) by mouth 4 (four) times daily -  with meals and at bedtime. As needed    Dispense:  40 tablet    Refill:  1  . valACYclovir (VALTREX) 500 MG tablet    Sig: Take 1 tablet (500 mg total) by mouth 2 (two) times daily. Take for 5 days when needed.    Dispense:  10 tablet    Refill:  5     Follow up plan: Return in about 4 weeks (around 01/01/2019) for GERD, Abdominal Pain, HTN, Cramping.   Saralyn Pilar, DO [facility] 12/04/2018, 8:28 AM

## 2018-12-04 NOTE — Patient Instructions (Signed)
Thank you for coming to the office today.  I am not sure the exact cause of your abdominal pain, however I am concerned that one significant possibility could be uncontrolled Acid Reflux (GERD) and may have developed an Ulcer (Peptic Ulcer of stomach).  After you have completed your H Pylori Breath Test today, we can start with the prescribed medicines, and we will notify you of your result and if we have to change treatment.  Before starting the prescribed treatment you need to complete the H Pylori Breath Test, either back at our office as soon as possible (Lab is open morning only 8am to 12noon, need a lab only appointment scheduled, and you need to be FASTING for 4 hours before the test, NO FOOD OR DRINK, only small amount of water is fine, can take other medicines).  IF BREATH TEST = NEGATIVE (or waiting for results): Starting TODAY before breakfast take Omeprazole 40mg  daily. Prefer to take this med about 30 min before breakfast or 1st meal of day for 4 weeks, don't stop taking unless we discuss first. Probably will need for about 8 weeks total if this ends up being an Ulcer.  Take other prescribed medicine Carafate (Sucralfate) as needed up to 4 times daily (3 meals and bedtime)  to coat stomach lining to ease symptoms, if it helps reduce symptoms then it is more likely to be due to acid and/or ulcer.  IF BREATH TEST = POSITIVE (we notified you of this result): - CHANGE Protonix dose from 40mg  daily to 40mg  (one pill) TWICE daily, about 30 min before breakfast and dinner - FOR TWO WEEKS, then resume DAILY treatment - Also we will send in TWO antibiotics to take IN ADDITION: - Amoxicillin 1g TWICE daily  / Clarithromycin 500mg  TWICE daily - both for 2 weeks  DIET RECOMMENDATIONS - Avoid spicy, greasy, fried foods, also things like caffeine, dark chocolate, peppermint can worsen - Avoid large meals and late night snacks, also do not go more than 4-5 hours without a snack or meal (not  eating will worsen reflux symptoms due to stomach acid) - You may also elevate the head of your bed at night to sleep at very slight incline to help reduce symptoms  If the problem improves but keeps coming back, we can discuss higher dose or longer course at next visit.  If symptoms are worsening or persistent despite treatment or develop any different severe esophagus or abdominal pain, unable to swallow solids or liquids, nausea, vomiting especially blood in vomit, fever/chills, or unintentional weight loss / no appetite, please follow-up sooner in office or seek more immediate medical attention at hospital Emergency Department.  Regarding other medicines:  - STOP taking Ibuprofen, Advil, Motrin, Goody's / BC powder - DO NOT take without discussing with your doctor. These medicines can put you at high risk for future bleeding.  If need pain medicine, may take Tylenol Extra Strength (Acetaminophen) 500mg  tabs - take 1 to 2 tabs per dose (max 1000mg ) every 6-8 hours for pain (take regularly, don't skip a dose for next 7 days), max 24 hour daily dose is 6 tablets or 3000mg . In the future you can repeat the same everyday Tylenol course for 1-2 weeks at a time.  --------- Leg cramps - Try spoonful of yellow mustard to relieve leg cramps or try daily to prevent the problem  - OTC natural option is Hyland's Leg Cramps (Dissolving tablet) take as needed for muscle cramps  - Also can try adding  a Mio or other electrolyte boost to your water, they should sell these as an additive packet or squirt bottle - or you can try low calorie drink WITH ELECTROLYTES gatorade G2 - not every drink, only once or twice a day have some in addition to your water  Check BP regularly at work - write down readings  Please schedule a Follow-up Appointment to: Return in about 4 weeks (around 01/01/2019) for GERD, Abdominal Pain, HTN, Cramping.  If you have any other questions or concerns, please feel free to call the  office or send a message through MyChart. You may also schedule an earlier appointment if necessary.  Additionally, you may be receiving a survey about your experience at our office within a few days to 1 week by e-mail or mail. We value your feedback.  Saralyn PilarAlexander Karamalegos, DO [facility]

## 2018-12-05 LAB — CBC WITH DIFFERENTIAL/PLATELET
Absolute Monocytes: 308 cells/uL (ref 200–950)
BASOS PCT: 0.5 %
Basophils Absolute: 21 cells/uL (ref 0–200)
EOS ABS: 29 {cells}/uL (ref 15–500)
EOS PCT: 0.7 %
HEMATOCRIT: 44.1 % (ref 38.5–50.0)
HEMOGLOBIN: 14.4 g/dL (ref 13.2–17.1)
LYMPHS ABS: 1800 {cells}/uL (ref 850–3900)
MCH: 24.8 pg — ABNORMAL LOW (ref 27.0–33.0)
MCHC: 32.7 g/dL (ref 32.0–36.0)
MCV: 75.9 fL — ABNORMAL LOW (ref 80.0–100.0)
MPV: 12.2 fL (ref 7.5–12.5)
Monocytes Relative: 7.5 %
NEUTROS ABS: 1943 {cells}/uL (ref 1500–7800)
Neutrophils Relative %: 47.4 %
Platelets: 223 10*3/uL (ref 140–400)
RBC: 5.81 10*6/uL — ABNORMAL HIGH (ref 4.20–5.80)
RDW: 15 % (ref 11.0–15.0)
Total Lymphocyte: 43.9 %
WBC: 4.1 10*3/uL (ref 3.8–10.8)

## 2018-12-05 LAB — COMPLETE METABOLIC PANEL WITH GFR
AG RATIO: 1.9 (calc) (ref 1.0–2.5)
ALBUMIN MSPROF: 4.6 g/dL (ref 3.6–5.1)
ALT: 20 U/L (ref 9–46)
AST: 22 U/L (ref 10–40)
Alkaline phosphatase (APISO): 27 U/L — ABNORMAL LOW (ref 40–115)
BILIRUBIN TOTAL: 1.2 mg/dL (ref 0.2–1.2)
BUN / CREAT RATIO: 9 (calc) (ref 6–22)
BUN: 13 mg/dL (ref 7–25)
CHLORIDE: 104 mmol/L (ref 98–110)
CO2: 28 mmol/L (ref 20–32)
Calcium: 9.6 mg/dL (ref 8.6–10.3)
Creat: 1.38 mg/dL — ABNORMAL HIGH (ref 0.60–1.35)
GFR, EST AFRICAN AMERICAN: 75 mL/min/{1.73_m2} (ref >=60)
GFR, EST NON AFRICAN AMERICAN: 65 mL/min/{1.73_m2} (ref >=60)
Globulin: 2.4 g/dL (calc) (ref 1.9–3.7)
Glucose, Bld: 86 mg/dL (ref 65–99)
POTASSIUM: 4.3 mmol/L (ref 3.5–5.3)
Sodium: 139 mmol/L (ref 135–146)
Total Protein: 7 g/dL (ref 6.1–8.1)

## 2018-12-05 LAB — LIPASE: Lipase: 5 U/L — ABNORMAL LOW (ref 7–60)

## 2018-12-05 LAB — MAGNESIUM: Magnesium: 1.9 mg/dL (ref 1.5–2.5)

## 2018-12-05 LAB — H. PYLORI BREATH TEST: H. pylori Breath Test: NOT DETECTED

## 2018-12-29 ENCOUNTER — Emergency Department
Admission: EM | Admit: 2018-12-29 | Discharge: 2018-12-29 | Disposition: A | Discharge status: Home / Self Care | Payer: Commercial Managed Care - PPO | Attending: Emergency Medicine | Admitting: Emergency Medicine

## 2018-12-29 ENCOUNTER — Other Ambulatory Visit: Payer: Self-pay

## 2018-12-29 DIAGNOSIS — I10 Essential (primary) hypertension: Secondary | ICD-10-CM | POA: Insufficient documentation

## 2018-12-29 DIAGNOSIS — J111 Influenza due to unidentified influenza virus with other respiratory manifestations: Secondary | ICD-10-CM | POA: Insufficient documentation

## 2018-12-29 DIAGNOSIS — R509 Fever, unspecified: Secondary | ICD-10-CM | POA: Diagnosis present

## 2018-12-29 MED ORDER — GUAIFENESIN-CODEINE 100-10 MG/5ML PO SOLN: 5.0000 mL | ORAL | 0 refills | Status: DC | PRN

## 2018-12-29 NOTE — ED Triage Notes (Signed)
A&O, ambulatory. States "I think I got the flu" states feeling bad x 3 days. Cough, congestion, fever (didn't take it). Mask in place.

## 2018-12-29 NOTE — Discharge Instructions (Addendum)
Follow-up with your primary care provider if any continued problems.  Take Tylenol or ibuprofen as needed for fever, body aches or headache.  Begin taking Robitussin-AC for cough.  This medication contains a narcotic therefore do not take this while you are driving or operating machinery.  Increase fluids.

## 2018-12-29 NOTE — ED Notes (Signed)
First nurse note  Body aches  Fever/chills

## 2018-12-29 NOTE — ED Notes (Signed)
Cough x a couple days, bodyaches and fever started today

## 2018-12-29 NOTE — ED Provider Notes (Signed)
[facility] Emergency Department Provider Note   ____________________________________________   First MD Initiated Contact with Patient 12/29/18 1254     (approximate)  I have reviewed the triage vital signs and the nursing notes.   HISTORY  Chief Complaint Influenza   HPI Carl Rice is a 38 y.o. male presents to the ED with complaint of  sudden onset of fever, chills, cough and congestion for 3 to 4 days.  Patient believes that he has the flu.  He rates his pain as 7 out of 10.   Past Medical History:  Diagnosis Date  . Genital herpes     Patient Active Problem List   Diagnosis Date Noted  . Alcohol dependence in early full remission (HCC) 12/04/2018  . Gastroesophageal reflux disease 12/04/2018  . Genital herpes 08/24/2015  . Essential hypertension 08/24/2015    History reviewed. No pertinent surgical history.  Prior to Admission medications   Medication Sig Start Date End Date Taking? Authorizing Provider  guaiFENesin-codeine 100-10 MG/5ML syrup Take 5 mLs by mouth every 4 (four) hours as needed. 12/29/18   Tommi RumpsSummers, Savior Himebaugh L, PA-C  omeprazole (PRILOSEC) 40 MG capsule Take 1 capsule (40 mg total) by mouth daily. 12/04/18   Karamalegos, Netta NeatAlexander J, DO  sucralfate (CARAFATE) 1 g tablet Take 1 tablet (1 g total) by mouth 4 (four) times daily -  with meals and at bedtime. As needed 12/04/18   Smitty CordsKaramalegos, Alexander J, DO  valACYclovir (VALTREX) 500 MG tablet Take 1 tablet (500 mg total) by mouth 2 (two) times daily. Take for 5 days when needed. 12/04/18   Karamalegos, Netta NeatAlexander J, DO    Allergies Lisinopril  Family History  Problem Relation Age of Onset  . Hypertension Mother   . Drug abuse Mother   . Drug abuse Father   . Hypertension Father   . Kidney disease Son     Social History Social History   Tobacco Use  . Smoking status: Never Smoker  . Smokeless tobacco: Never Used  Substance Use Topics  . Alcohol use: Yes   Alcohol/week: 0.0 standard drinks  . Drug use: Yes    Types: Marijuana    Review of Systems Constitutional: Positive fever/chills Eyes: No visual changes. ENT: No sore throat.  Positive nasal congestion. Cardiovascular: Denies chest pain. Respiratory: Denies shortness of breath.  Positive cough. Gastrointestinal: No abdominal pain.  No nausea, no vomiting.  No diarrhea.  No constipation. Genitourinary: Negative for dysuria. Musculoskeletal: Positive for body aches. Skin: Negative for rash. Neurological: Positive for headaches, negative for focal weakness or numbness. ____________________________________________   PHYSICAL EXAM:  VITAL SIGNS: ED Triage Vitals  Enc Vitals Group     BP 12/29/18 1212 (!) 162/111     Pulse Rate 12/29/18 1212 81     Resp 12/29/18 1212 18     Temp 12/29/18 1212 100.3 F (37.9 C)     Temp Source 12/29/18 1212 Oral     SpO2 12/29/18 1212 99 %     Weight 12/29/18 1213 165 lb (74.8 kg)     Height 12/29/18 1213 5\' 9"  (1.753 m)     Head Circumference --      Peak Flow --      Pain Score 12/29/18 1212 7     Pain Loc --      Pain Edu? --      Excl. in GC? --    Constitutional: Alert and oriented. Well appearing and in no acute distress. Eyes: Conjunctivae are  normal.  Head: Atraumatic. Nose: Mild congestion/rhinnorhea.  TMs are clear bilaterally. Mouth/Throat: Mucous membranes are moist.  Oropharynx non-erythematous. Neck: No stridor.   Hematological/Lymphatic/Immunilogical: No cervical lymphadenopathy. Cardiovascular: Normal rate, regular rhythm. Grossly normal heart sounds.  Good peripheral circulation. Respiratory: Normal respiratory effort.  No retractions. Lungs CTAB. Gastrointestinal: Soft and nontender. No distention.  Musculoskeletal: Moves upper and lower extremities without any difficulty.  Normal gait was noted. Neurologic:  Normal speech and language. No gross focal neurologic deficits are appreciated. No gait instability. Skin:  Skin  is warm, dry and intact. No rash noted. Psychiatric: Mood and affect are normal. Speech and behavior are normal.  ____________________________________________   LABS (all labs ordered are listed, but only abnormal results are displayed)  Labs Reviewed - No data to display  PROCEDURES  Procedure(s) performed: None  Procedures  Critical Care performed: No  ____________________________________________   INITIAL IMPRESSION / ASSESSMENT AND PLAN / ED COURSE  As part of my medical decision making, I reviewed the following data within the electronic MEDICAL RECORD NUMBER Notes from prior ED visits and [place] Controlled Substance Database  Patient presents to the ED with a 3 to 4-day history of sudden onset of flulike symptoms.  Patient states that the cough is keeping him up at night.  We discussed that he is not a candidate at this time for Tamiflu.  Patient was given a prescription for guaifenesin with codeine as needed for cough and congestion.  He is aware that he cannot drive or operate machinery while taking this narcotic.  He is encouraged to increase fluids.  He was also given a note to remain out of work.  ____________________________________________   FINAL CLINICAL IMPRESSION(S) / ED DIAGNOSES  Final diagnoses:  Influenza     ED Discharge Orders         Ordered    guaiFENesin-codeine 100-10 MG/5ML syrup  Every 4 hours PRN     12/29/18 1344           Note:  This document was prepared using Dragon voice recognition software and may include unintentional dictation errors.    Tommi Rumps, PA-C 12/29/18 1407    Sharyn Creamer, MD 12/29/18 [phone]

## 2019-01-01 ENCOUNTER — Encounter: Payer: Self-pay | Admitting: Family Medicine

## 2019-01-01 ENCOUNTER — Telehealth: Payer: Self-pay | Admitting: Family Medicine

## 2019-01-01 NOTE — Telephone Encounter (Signed)
Pt said he went to ER for flu but he needs a note to return to work tomorrow.  His call back number is [phone]

## 2019-01-01 NOTE — Telephone Encounter (Signed)
Spoke to patient and advised to get it from ER who treated him yesterday but as per patient his work place doesn't except ER note ?

## 2019-01-01 NOTE — Telephone Encounter (Signed)
Note written. He can pick up anytime.  Saralyn Pilar, DO [facility] 01/01/2019, 12:19 PM

## 2019-01-15 ENCOUNTER — Ambulatory Visit: Payer: Commercial Managed Care - PPO | Admitting: Family Medicine

## 2019-02-25 ENCOUNTER — Other Ambulatory Visit: Payer: Self-pay | Admitting: Family Medicine

## 2019-02-25 DIAGNOSIS — K219 Gastro-esophageal reflux disease without esophagitis: Secondary | ICD-10-CM

## 2019-02-25 DIAGNOSIS — R1012 Left upper quadrant pain: Secondary | ICD-10-CM

## 2019-02-25 DIAGNOSIS — G8929 Other chronic pain: Secondary | ICD-10-CM

## 2019-03-04 ENCOUNTER — Ambulatory Visit: Payer: Commercial Managed Care - PPO | Admitting: Family Medicine

## 2019-03-04 ENCOUNTER — Other Ambulatory Visit: Payer: Self-pay

## 2020-01-15 ENCOUNTER — Ambulatory Visit: Payer: Commercial Managed Care - PPO | Admitting: Family Medicine

## 2020-01-21 ENCOUNTER — Ambulatory Visit: Payer: Commercial Managed Care - PPO | Admitting: Family Medicine

## 2020-02-11 ENCOUNTER — Encounter: Payer: Self-pay | Admitting: Family Medicine

## 2020-02-11 ENCOUNTER — Other Ambulatory Visit: Payer: Self-pay

## 2020-02-11 ENCOUNTER — Ambulatory Visit (INDEPENDENT_AMBULATORY_CARE_PROVIDER_SITE_OTHER): Payer: Commercial Managed Care - PPO | Admitting: Family Medicine

## 2020-02-11 DIAGNOSIS — A6002 Herpesviral infection of other male genital organs: Secondary | ICD-10-CM | POA: Diagnosis not present

## 2020-02-11 MED ORDER — VALACYCLOVIR HCL 500 MG PO TABS: 500.0000 mg | ORAL_TABLET | Freq: Two times a day (BID) | ORAL | 5 refills | Status: DC

## 2020-02-11 NOTE — Progress Notes (Signed)
Virtual Visit via Telephone The purpose of this virtual visit is to provide medical care while limiting exposure to the novel coronavirus (COVID19) for both patient and office staff.  Consent was obtained for phone visit:  Yes.   Answered questions that patient had about telehealth interaction:  Yes.   I discussed the limitations, risks, security and privacy concerns of performing an evaluation and management service by telephone. I also discussed with the patient that there may be a patient responsible charge related to this service. The patient expressed understanding and agreed to proceed.  Patient Location: Home Provider Location: [facility])  ---------------------------------------------------------------------- Chief Complaint  Patient presents with  . Herpes Zoster    need refill for valtrex    S: Reviewed CMA documentation. I have called patient and gathered additional HPI as follows:  Recurrent Genital Herpes / Acute outbreak Reports that symptoms started recently past week with acute outbreak, he is out of valtrex, cannot get refills last order 11/2018, he usually takes 500mg  BID x 5 days and it resolves, he may go up to 5-6 month without flare then can have a few in closer proximity  Denies any known or suspected exposure to person with or possibly with COVID19.  Denies any fevers, chills, sweats, body ache, cough, shortness of breath, sinus pain or pressure, headache, abdominal pain, diarrhea  Past Medical History:  Diagnosis Date  . Genital herpes    Social History   Tobacco Use  . Smoking status: Never Smoker  . Smokeless tobacco: Never Used  Substance Use Topics  . Alcohol use: Yes    Alcohol/week: 0.0 standard drinks  . Drug use: Yes    Types: Marijuana    Family History  Problem Relation Age of Onset  . Hypertension Mother   . Drug abuse Mother   . Drug abuse Father   . Hypertension Father   . Kidney disease Son      Current  Outpatient Medications:  .  omeprazole (PRILOSEC) 40 MG capsule, TAKE 1 CAPSULE BY MOUTH EVERY DAY, Disp: 90 capsule, Rfl: 0 .  sucralfate (CARAFATE) 1 g tablet, Take 1 tablet (1 g total) by mouth 4 (four) times daily -  with meals and at bedtime. As needed, Disp: 40 tablet, Rfl: 1 .  valACYclovir (VALTREX) 500 MG tablet, Take 1 tablet (500 mg total) by mouth 2 (two) times daily. Take for 5 days when needed., Disp: 20 tablet, Rfl: 5  Depression screen [facility] 2/9 02/11/2020 12/04/2018 07/17/2017  Decreased Interest 0 0 0  Down, Depressed, Hopeless 0 0 0  PHQ - 2 Score 0 0 0  Altered sleeping - - -  Tired, decreased energy - - -  Change in appetite - - -  Feeling bad or failure about yourself  - - -  Trouble concentrating - - -  Moving slowly or fidgety/restless - - -  Suicidal thoughts - - -  PHQ-9 Score - - -    No flowsheet data found.  -------------------------------------------------------------------------- O: No physical exam performed due to remote telephone encounter.  Lab results reviewed.  No results found for this or any previous visit (from the past 2160 hour(s)).  -------------------------------------------------------------------------- A&P:  Problem List Items Addressed This Visit    Genital herpes   Relevant Medications   valACYclovir (VALTREX) 500 MG tablet     Acute HSV2 genital herpes outbreak Chronic recurrent issue On PRN therapy Valtrex, not on suppression therapy Out of med Re order Valtrex 500 BID x  5 days, gave #20 pills now, with refills, can repeat course if need Consider suppression dose in future  Meds ordered this encounter  Medications  . valACYclovir (VALTREX) 500 MG tablet    Sig: Take 1 tablet (500 mg total) by mouth 2 (two) times daily. Take for 5 days when needed.    Dispense:  20 tablet    Refill:  5    Follow-up: - Return in 2-4 weeks for annual physical and labs in AM after visit same day  Note office staff notified me that  patient has had multiple no shows and same day cancellations, when he was asked to schedule future physical. We will review that with him and encourage him to keep apt at risk of discharge from practice if continued no show/same day cancellation.  Patient verbalizes understanding with the above medical recommendations including the limitation of remote medical advice.  Specific follow-up and call-back criteria were given for patient to follow-up or seek medical care more urgently if needed.   - Time spent in direct consultation with patient on phone: 7 minutes   Saralyn Pilar, DO [facility] 02/11/2020, 1:27 PM

## 2020-02-19 ENCOUNTER — Other Ambulatory Visit: Payer: Self-pay

## 2020-02-19 ENCOUNTER — Ambulatory Visit (INDEPENDENT_AMBULATORY_CARE_PROVIDER_SITE_OTHER): Payer: Commercial Managed Care - PPO | Admitting: Family Medicine

## 2020-02-19 ENCOUNTER — Ambulatory Visit: Payer: Commercial Managed Care - PPO | Attending: Internal Medicine

## 2020-02-19 ENCOUNTER — Encounter: Payer: Self-pay | Admitting: Family Medicine

## 2020-02-19 VITALS — BP 148/98 | HR 56 | Temp 97.5°F | Resp 16 | Ht 69.0 in | Wt 170.0 lb

## 2020-02-19 DIAGNOSIS — Z0000 Encounter for general adult medical examination without abnormal findings: Secondary | ICD-10-CM

## 2020-02-19 DIAGNOSIS — Z23 Encounter for immunization: Secondary | ICD-10-CM

## 2020-02-19 DIAGNOSIS — Z20822 Contact with and (suspected) exposure to covid-19: Secondary | ICD-10-CM | POA: Diagnosis not present

## 2020-02-19 DIAGNOSIS — Z114 Encounter for screening for human immunodeficiency virus [HIV]: Secondary | ICD-10-CM

## 2020-02-19 DIAGNOSIS — I10 Essential (primary) hypertension: Secondary | ICD-10-CM | POA: Diagnosis not present

## 2020-02-19 DIAGNOSIS — R7309 Other abnormal glucose: Secondary | ICD-10-CM

## 2020-02-19 DIAGNOSIS — F1020 Alcohol dependence, uncomplicated: Secondary | ICD-10-CM

## 2020-02-19 NOTE — Assessment & Plan Note (Addendum)
Uncontrolled HTN limited readings, repeat manual still elevated - Outside BP readings elevated but infrequent No known complications - history of elevated Creatinine - ACEi intolerance cough - Concern with history of alcohol consumption, now significantly reduced  Previously on Amlodipine as well.  Plan:  1. Discussion on HTN management. Recommendation is to start back on / new HTN medication. However, will defer for 3 months as patient improves lifestyle and monitors BP outside of office. - He will notify us of results and readings, and may follow-up within 3 month anticipate resume or restart Amlodipine up to 10mg  daily 2. Encourage improved lifestyle - low sodium diet, regular exercise 3. Start monitor BP outside office, bring readings to next visit, if persistently >140/90 or new symptoms notify office sooner  F/u within 3 months

## 2020-02-19 NOTE — Progress Notes (Signed)
Subjective:    Patient ID: Carl Rice, male    DOB: August 07, 1981, 39 y.o.   MRN: [id number]  Carl Rice is a 39 y.o. male presenting on 02/19/2020 for Annual Exam   HPI   Here for Annual Physical and Lab Review.  CHRONIC HTN / Overweight BMI >25 / Alcohol Dependence. Reports not checking BP at home or work at this time. Prior history of elevated BP in past, he took Lisinopril and had reaction to it with coughing. Current Meds - none   Lifestyle: - Diet: admits eating foods that have higher sodium content, goal to limit salt. Drinks water, no sodas. - Exercise: Just recently restarted gym routine. Fam history of HTN. Admits drinking hard alcoholic drinks 1-3 per day, 5% alcohol can, not beer or liquor anymore, goal to limit and reduce. He can go several days without drinking without complications of withdrawal Denies CP, dyspnea, HA, edema, dizziness / lightheadedness  Request covid19 antibody. No active symptoms.  Health Maintenance: Due for COVID19 vaccine when eligible. Not indicated for colorectal or prostate cancer screening at age 53. Due routine HIV screening  Depression screen [facility] 2/9 02/19/2020 02/11/2020 12/04/2018  Decreased Interest 0 0 0  Down, Depressed, Hopeless 0 0 0  PHQ - 2 Score 0 0 0  Altered sleeping - - -  Tired, decreased energy - - -  Change in appetite - - -  Feeling bad or failure about yourself  - - -  Trouble concentrating - - -  Moving slowly or fidgety/restless - - -  Suicidal thoughts - - -  PHQ-9 Score - - -    Past Medical History:  Diagnosis Date  . Genital herpes    History reviewed. No pertinent surgical history. Social History   Socioeconomic History  . Marital status: Single    Spouse name: Not on file  . Number of children: Not on file  . Years of education: Not on file  . Highest education level: Not on file  Occupational History  . Not on file  Tobacco Use  . Smoking status: Never Smoker  . Smokeless tobacco: Never Used    Substance and Sexual Activity  . Alcohol use: Yes    Alcohol/week: 0.0 standard drinks  . Drug use: Yes    Types: Marijuana  . Sexual activity: Not on file  Other Topics Concern  . Not on file  Social History Narrative  . Not on file   Social Determinants of Health   Financial Resource Strain:   . Difficulty of Paying Living Expenses:   Food Insecurity:   . Worried About Programme researcher, broadcasting/film/video in the Last Year:   . Barista in the Last Year:   Transportation Needs:   . Freight forwarder (Medical):   Marland Kitchen Lack of Transportation (Non-Medical):   Physical Activity:   . Days of Exercise per Week:   . Minutes of Exercise per Session:   Stress:   . Feeling of Stress :   Social Connections:   . Frequency of Communication with Friends and Family:   . Frequency of Social Gatherings with Friends and Family:   . Attends Religious Services:   . Active Member of Clubs or Organizations:   . Attends Banker Meetings:   Marland Kitchen Marital Status:   Intimate Partner Violence:   . Fear of Current or Ex-Partner:   . Emotionally Abused:   Marland Kitchen Physically Abused:   . Sexually Abused:    Family  History  Problem Relation Age of Onset  . Hypertension Mother   . Drug abuse Mother   . Drug abuse Father   . Hypertension Father   . Kidney disease Son    Current Outpatient Medications on File Prior to Visit  Medication Sig  . omeprazole (PRILOSEC) 40 MG capsule TAKE 1 CAPSULE BY MOUTH EVERY DAY  . sucralfate (CARAFATE) 1 g tablet Take 1 tablet (1 g total) by mouth 4 (four) times daily -  with meals and at bedtime. As needed  . valACYclovir (VALTREX) 500 MG tablet Take 1 tablet (500 mg total) by mouth 2 (two) times daily. Take for 5 days when needed.   No current facility-administered medications on file prior to visit.    Review of Systems  Constitutional: Negative for activity change, appetite change, chills, diaphoresis, fatigue and fever.  HENT: Negative for congestion and  hearing loss.   Eyes: Negative for visual disturbance.  Respiratory: Negative for apnea, cough, chest tightness, shortness of breath and wheezing.   Cardiovascular: Negative for chest pain, palpitations and leg swelling.  Gastrointestinal: Negative for abdominal pain, anal bleeding, blood in stool, constipation, diarrhea, nausea and vomiting.  Endocrine: Negative for cold intolerance.  Genitourinary: Negative for decreased urine volume, difficulty urinating, dysuria, frequency, hematuria, testicular pain and urgency.  Musculoskeletal: Negative for arthralgias, back pain and neck pain.  Skin: Negative for rash.  Allergic/Immunologic: Negative for environmental allergies.  Neurological: Negative for dizziness, weakness, light-headedness, numbness and headaches.  Hematological: Negative for adenopathy.  Psychiatric/Behavioral: Negative for behavioral problems, dysphoric mood and sleep disturbance. The patient is not nervous/anxious.    Per HPI unless specifically indicated above      Objective:    BP (!) 148/98 (BP Location: Left Arm, Cuff Size: Normal)   Pulse (!) 56   Temp (!) 97.5 F (36.4 C) (Temporal)   Resp 16   Ht 5\' 9"  (1.753 m)   Wt 170 lb (77.1 kg)   BMI 25.10 kg/m   Wt Readings from Last 3 Encounters:  02/19/20 170 lb (77.1 kg)  12/29/18 165 lb (74.8 kg)  12/04/18 166 lb 12.8 oz (75.7 kg)    Physical Exam Vitals and nursing note reviewed.  Constitutional:      General: He is not in acute distress.    Appearance: He is well-developed. He is not diaphoretic.     Comments: Well-appearing, comfortable, cooperative  HENT:     Head: Normocephalic and atraumatic.  Eyes:     General:        Right eye: No discharge.        Left eye: No discharge.     Conjunctiva/sclera: Conjunctivae normal.     Pupils: Pupils are equal, round, and reactive to light.  Neck:     Thyroid: No thyromegaly.  Cardiovascular:     Rate and Rhythm: Normal rate and regular rhythm.     Heart  sounds: Normal heart sounds. No murmur.  Pulmonary:     Effort: Pulmonary effort is normal. No respiratory distress.     Breath sounds: Normal breath sounds. No wheezing or rales.  Abdominal:     General: Bowel sounds are normal. There is no distension.     Palpations: Abdomen is soft. There is no mass.     Tenderness: There is no abdominal tenderness.  Musculoskeletal:        General: No tenderness. Normal range of motion.     Cervical back: Normal range of motion and neck supple.  Comments: Upper / Lower Extremities: - Normal muscle tone, strength bilateral upper extremities 5/5, lower extremities 5/5  Lymphadenopathy:     Cervical: No cervical adenopathy.  Skin:    General: Skin is warm and dry.     Findings: No erythema or rash.  Neurological:     Mental Status: He is alert and oriented to person, place, and time.     Comments: Distal sensation intact to light touch all extremities  Psychiatric:        Behavior: Behavior normal.     Comments: Well groomed, good eye contact, normal speech and thoughts        Results for orders placed or performed in visit on 12/04/18  CBC with Differential/Platelet  Result Value Ref Range   WBC 4.1 3.8 - 10.8 Thousand/uL   RBC 5.81 (H) 4.20 - 5.80 Million/uL   Hemoglobin 14.4 13.2 - 17.1 g/dL   HCT 78.2 95.6 - 21.3 %   MCV 75.9 (L) 80.0 - 100.0 fL   MCH 24.8 (L) 27.0 - 33.0 pg   MCHC 32.7 32.0 - 36.0 g/dL   RDW 08.6 57.8 - 46.9 %   Platelets 223 140 - 400 Thousand/uL   MPV 12.2 7.5 - 12.5 fL   Neutro Abs 1,943 1,500 - 7,800 cells/uL   Lymphs Abs 1,800 850 - 3,900 cells/uL   Absolute Monocytes 308 200 - 950 cells/uL   Eosinophils Absolute 29 15 - 500 cells/uL   Basophils Absolute 21 0 - 200 cells/uL   Neutrophils Relative % 47.4 %   Total Lymphocyte 43.9 %   Monocytes Relative 7.5 %   Eosinophils Relative 0.7 %   Basophils Relative 0.5 %  COMPLETE METABOLIC PANEL WITH GFR  Result Value Ref Range   Glucose, Bld 86 65 - 99  mg/dL   BUN 13 7 - 25 mg/dL   Creat 6.29 (H) 5.28 - 1.35 mg/dL   GFR, Est Non African American 65 > OR = 60 mL/min/1.82m2   GFR, Est African American 75 > OR = 60 mL/min/1.51m2   BUN/Creatinine Ratio 9 6 - 22 (calc)   Sodium 139 135 - 146 mmol/L   Potassium 4.3 3.5 - 5.3 mmol/L   Chloride 104 98 - 110 mmol/L   CO2 28 20 - 32 mmol/L   Calcium 9.6 8.6 - 10.3 mg/dL   Total Protein 7.0 6.1 - 8.1 g/dL   Albumin 4.6 3.6 - 5.1 g/dL   Globulin 2.4 1.9 - 3.7 g/dL (calc)   AG Ratio 1.9 1.0 - 2.5 (calc)   Total Bilirubin 1.2 0.2 - 1.2 mg/dL   Alkaline phosphatase (APISO) 27 (L) 40 - 115 U/L   AST 22 10 - 40 U/L   ALT 20 9 - 46 U/L  H. pylori breath test  Result Value Ref Range   H. pylori Breath Test NOT DETECTED NOT DETECT  Lipase  Result Value Ref Range   Lipase 5 (L) 7 - 60 U/L  Magnesium  Result Value Ref Range   Magnesium 1.9 1.5 - 2.5 mg/dL      Assessment & Plan:   Problem List Items Addressed This Visit    Essential hypertension    Uncontrolled HTN limited readings, repeat manual still elevated - Outside BP readings elevated but infrequent No known complications - history of elevated Creatinine - ACEi intolerance cough - Concern with history of alcohol consumption, now significantly reduced  Previously on Amlodipine as well.  Plan:  1. Discussion on HTN management. Recommendation is  to start back on / new HTN medication. However, will defer for 3 months as patient improves lifestyle and monitors BP outside of office. - He will notify us of results and readings, and may follow-up within 3 month anticipate resume or restart Amlodipine up to 10mg  daily 2. Encourage improved lifestyle - low sodium diet, regular exercise 3. Start monitor BP outside office, bring readings to next visit, if persistently >140/90 or new symptoms notify office sooner  F/u within 3 months      Relevant Orders   Hemoglobin A1c   CBC with Differential/Platelet   COMPLETE METABOLIC PANEL WITH GFR    Lipid panel   Alcohol dependence (HCC)    Stable problem Chronic issue, now reduced alcohol consumption, still drinking daily No complicated withdrawals Counseling on alcohol cessation and other health considerations and risk       Other Visit Diagnoses    Annual physical exam    -  Primary   Relevant Orders   Hemoglobin A1c   CBC with Differential/Platelet   COMPLETE METABOLIC PANEL WITH GFR   Lipid panel   Screening for HIV (human immunodeficiency virus)       Relevant Orders   HIV Antibody (routine testing w rflx)   Exposure to COVID-19 virus       Relevant Orders   SAR CoV2 Serology (COVID 19)AB(IGG)IA   Abnormal glucose       Relevant Orders   Hemoglobin A1c      Updated Health Maintenance information Check fasting lab panel today - CMET LIPID A1c CBC and HIV screen Also requested COVID19 IGG antibody, discussed utility of this, patient opts to still check it Encouraged improvement to lifestyle with diet and exercise Maintain healthy weight  No orders of the defined types were placed in this encounter.    Follow up plan: Return in about 3 months (around 05/20/2020) for 3 month HTN - can do face to face or virtual.  07/21/2020, DO [facility] 02/19/2020, 9:40 AM

## 2020-02-19 NOTE — Patient Instructions (Addendum)
Thank you for coming to the office today.  Labs today, stay tuned for results.  Keep track of BP at home and work few times a week, write down readings, discuss at next visit, may consider new medicine.  Amlodipine BP medication, check into this one.  Please schedule a Follow-up Appointment to: Return in about 3 months (around 05/20/2020) for 3 month HTN - can do face to face or virtual.  If you have any other questions or concerns, please feel free to call the office or send a message through MyChart. You may also schedule an earlier appointment if necessary.  Additionally, you may be receiving a survey about your experience at our office within a few days to 1 week by e-mail or mail. We value your feedback.  Saralyn Pilar, DO [facility] [place], [place]

## 2020-02-19 NOTE — Progress Notes (Signed)
   Covid-19 Vaccination Clinic  Name:  Carl Rice    MRN: [id number] DOB: 1981-06-13  02/19/2020  Mr. Carl Rice was observed post Covid-19 immunization for 15 minutes without incident. He was provided with Vaccine Information Sheet and instruction to access the V-Safe system.   Mr. Carl Rice was instructed to call 911 with any severe reactions post vaccine: Marland Kitchen Difficulty breathing  . Swelling of face and throat  . A fast heartbeat  . A bad rash all over body  . Dizziness and weakness   Immunizations Administered    Name Date Dose VIS Date Route   Pfizer COVID-19 Vaccine 02/19/2020  6:32 PM 0.3 mL 10/31/2019 Intramuscular   Manufacturer: [employer], [employer]   Lot: [id number]   NDC: [address]-8

## 2020-02-19 NOTE — Assessment & Plan Note (Signed)
Stable problem Chronic issue, now reduced alcohol consumption, still drinking daily No complicated withdrawals Counseling on alcohol cessation and other health considerations and risk

## 2020-02-20 LAB — CBC WITH DIFFERENTIAL/PLATELET
Absolute Monocytes: 230 cells/uL (ref 200–950)
Basophils Absolute: 11 cells/uL (ref 0–200)
Basophils Relative: 0.3 %
Eosinophils Absolute: 90 cells/uL (ref 15–500)
Eosinophils Relative: 2.5 %
HCT: 43.8 % (ref 38.5–50.0)
Hemoglobin: 14 g/dL (ref 13.2–17.1)
Lymphs Abs: 1490 cells/uL (ref 850–3900)
MCH: 24.3 pg — ABNORMAL LOW (ref 27.0–33.0)
MCHC: 32 g/dL (ref 32.0–36.0)
MCV: 76.2 fL — ABNORMAL LOW (ref 80.0–100.0)
MPV: 12 fL (ref 7.5–12.5)
Monocytes Relative: 6.4 %
Neutro Abs: 1778 cells/uL (ref 1500–7800)
Neutrophils Relative %: 49.4 %
Platelets: 204 10*3/uL (ref 140–400)
RBC: 5.75 10*6/uL (ref 4.20–5.80)
RDW: 14.8 % (ref 11.0–15.0)
Total Lymphocyte: 41.4 %
WBC: 3.6 10*3/uL — ABNORMAL LOW (ref 3.8–10.8)

## 2020-02-20 LAB — COMPLETE METABOLIC PANEL WITH GFR
AG Ratio: 1.7 (calc) (ref 1.0–2.5)
ALT: 39 U/L (ref 9–46)
AST: 53 U/L — ABNORMAL HIGH (ref 10–40)
Albumin: 4.3 g/dL (ref 3.6–5.1)
Alkaline phosphatase (APISO): 27 U/L — ABNORMAL LOW (ref 36–130)
BUN: 15 mg/dL (ref 7–25)
CO2: 26 mmol/L (ref 20–32)
Calcium: 9.3 mg/dL (ref 8.6–10.3)
Chloride: 107 mmol/L (ref 98–110)
Creat: 1.15 mg/dL (ref 0.60–1.35)
GFR, Est African American: 93 mL/min/{1.73_m2} (ref >=60)
GFR, Est Non African American: 80 mL/min/{1.73_m2} (ref >=60)
Globulin: 2.5 g/dL (calc) (ref 1.9–3.7)
Glucose, Bld: 94 mg/dL (ref 65–99)
Potassium: 4.3 mmol/L (ref 3.5–5.3)
Sodium: 139 mmol/L (ref 135–146)
Total Bilirubin: 0.7 mg/dL (ref 0.2–1.2)
Total Protein: 6.8 g/dL (ref 6.1–8.1)

## 2020-02-20 LAB — HEMOGLOBIN A1C
Hgb A1c MFr Bld: 5.2 % of total Hgb (ref <5.7)
Mean Plasma Glucose: 103 (calc)
eAG (mmol/L): 5.7 (calc)

## 2020-02-20 LAB — LIPID PANEL
Cholesterol: 157 mg/dL (ref <200)
HDL: 56 mg/dL (ref >=40)
LDL Cholesterol (Calc): 90 mg/dL (calc)
Non-HDL Cholesterol (Calc): 101 mg/dL (calc) (ref <130)
Total CHOL/HDL Ratio: 2.8 (calc) (ref <5.0)
Triglycerides: 39 mg/dL (ref <150)

## 2020-02-20 LAB — SAR COV2 SEROLOGY (COVID19)AB(IGG),IA: SARS CoV2 AB IGG: NEGATIVE

## 2020-02-20 LAB — HIV ANTIBODY (ROUTINE TESTING W REFLEX): HIV 1&2 Ab, 4th Generation: NONREACTIVE

## 2020-03-13 ENCOUNTER — Ambulatory Visit: Payer: Commercial Managed Care - PPO | Attending: Internal Medicine

## 2020-03-14 ENCOUNTER — Ambulatory Visit: Payer: Commercial Managed Care - PPO

## 2020-03-14 DIAGNOSIS — Z23 Encounter for immunization: Secondary | ICD-10-CM

## 2020-03-14 NOTE — Progress Notes (Signed)
   Covid-19 Vaccination Clinic  Name:  Carl Rice    MRN: [id number] DOB: 1981/01/27  03/14/2020  Mr. Carl Rice was observed post Covid-19 immunization for 15 minutes without incident. He was provided with Vaccine Information Sheet and instruction to access the V-Safe system.   Mr. Carl Rice was instructed to call 911 with any severe reactions post vaccine: Marland Kitchen Difficulty breathing  . Swelling of face and throat  . A fast heartbeat  . A bad rash all over body  . Dizziness and weakness   Immunizations Administered    Name Date Dose VIS Date Route   Pfizer COVID-19 Vaccine 03/14/2020  6:16 PM 0.3 mL 01/14/2019 Intramuscular   Manufacturer: [employer], [employer]   Lot: [id number]   NDC: [address]-2

## 2020-05-31 ENCOUNTER — Encounter: Payer: Self-pay | Admitting: Family Medicine

## 2020-05-31 ENCOUNTER — Ambulatory Visit (INDEPENDENT_AMBULATORY_CARE_PROVIDER_SITE_OTHER): Payer: Commercial Managed Care - PPO | Admitting: Family Medicine

## 2020-05-31 ENCOUNTER — Other Ambulatory Visit: Payer: Self-pay

## 2020-05-31 VITALS — BP 125/87 | HR 67 | Temp 98.4°F | Resp 16 | Ht 69.0 in | Wt 166.6 lb

## 2020-05-31 DIAGNOSIS — I10 Essential (primary) hypertension: Secondary | ICD-10-CM

## 2020-05-31 DIAGNOSIS — F1020 Alcohol dependence, uncomplicated: Secondary | ICD-10-CM

## 2020-05-31 NOTE — Progress Notes (Signed)
Subjective:    Patient ID: Carl Rice, male    DOB: 10/04/1981, 39 y.o.   MRN: [id number]  Carl Rice is a 39 y.o. male presenting on 05/31/2020 for Hypertension   HPI   CHRONIC HTN / Overweight BMI >24 / Alcohol Dependence. - Last visit with me 02/2020, for same problem, treated with lifestyle management without medicine, see prior notes for background information. - Interval update with BP readings at work avg 130-140 range, and DBP with 80-90s. With mostly lifestyle improvement reduced sodium, exercise, reduced alcohol - Today patient reports no new concerns. Current Meds - none - Past medication Lisinopril - caused cough side effect Lifestyle: - Diet: Now improved diet, on low sodium diet. Drinks water, no sodas. - Exercise: resume gym work out Fam history of HTN. Admits drinking alcohol now 1-2x per week with reduced amount, previously a few a day. He can go several days without drinking without complications of withdrawal Denies CP, dyspnea, HA, edema, dizziness / lightheadedness  UTD COVID vaccine.  Depression screen [facility] 2/9 05/31/2020 02/19/2020 02/11/2020  Decreased Interest 0 0 0  Down, Depressed, Hopeless 0 0 0  PHQ - 2 Score 0 0 0  Altered sleeping - - -  Tired, decreased energy - - -  Change in appetite - - -  Feeling bad or failure about yourself  - - -  Trouble concentrating - - -  Moving slowly or fidgety/restless - - -  Suicidal thoughts - - -  PHQ-9 Score - - -    Social History   Tobacco Use  . Smoking status: Never Smoker  . Smokeless tobacco: Never Used  Substance Use Topics  . Alcohol use: Yes    Alcohol/week: 0.0 standard drinks  . Drug use: Yes    Types: Marijuana    Review of Systems Per HPI unless specifically indicated above     Objective:    BP 125/87   Pulse 67   Temp 98.4 F (36.9 C) (Temporal)   Resp 16   Ht 5\' 9"  (1.753 m)   Wt 166 lb 9.6 oz (75.6 kg)   SpO2 100%   BMI 24.60 kg/m   Wt Readings from Last 3 Encounters:    05/31/20 166 lb 9.6 oz (75.6 kg)  02/19/20 170 lb (77.1 kg)  12/29/18 165 lb (74.8 kg)    Physical Exam Vitals and nursing note reviewed.  Constitutional:      General: He is not in acute distress.    Appearance: He is well-developed. He is not diaphoretic.     Comments: Well-appearing, comfortable, cooperative  HENT:     Head: Normocephalic and atraumatic.  Eyes:     General:        Right eye: No discharge.        Left eye: No discharge.     Conjunctiva/sclera: Conjunctivae normal.  Neck:     Thyroid: No thyromegaly.  Cardiovascular:     Rate and Rhythm: Normal rate and regular rhythm.     Heart sounds: Normal heart sounds. No murmur heard.   Pulmonary:     Effort: Pulmonary effort is normal. No respiratory distress.     Breath sounds: Normal breath sounds. No wheezing or rales.  Musculoskeletal:        General: Normal range of motion.     Cervical back: Normal range of motion and neck supple.  Lymphadenopathy:     Cervical: No cervical adenopathy.  Skin:    General: Skin is warm and  dry.     Findings: No erythema or rash.  Neurological:     Mental Status: He is alert and oriented to person, place, and time.  Psychiatric:        Behavior: Behavior normal.     Comments: Well groomed, good eye contact, normal speech and thoughts    Results for orders placed or performed in visit on 02/19/20  Hemoglobin A1c  Result Value Ref Range   Hgb A1c MFr Bld 5.2 <5.7 % of total Hgb   Mean Plasma Glucose 103 (calc)   eAG (mmol/L) 5.7 (calc)  CBC with Differential/Platelet  Result Value Ref Range   WBC 3.6 (L) 3.8 - 10.8 Thousand/uL   RBC 5.75 4.20 - 5.80 Million/uL   Hemoglobin 14.0 13.2 - 17.1 g/dL   HCT 27.7 38 - 50 %   MCV 76.2 (L) 80.0 - 100.0 fL   MCH 24.3 (L) 27.0 - 33.0 pg   MCHC 32.0 32.0 - 36.0 g/dL   RDW 82.4 23.5 - 36.1 %   Platelets 204 140 - 400 Thousand/uL   MPV 12.0 7.5 - 12.5 fL   Neutro Abs 1,778 1,500 - 7,800 cells/uL   Lymphs Abs 1,490 850 - 3,900  cells/uL   Absolute Monocytes 230 200 - 950 cells/uL   Eosinophils Absolute 90 15 - 500 cells/uL   Basophils Absolute 11 0 - 200 cells/uL   Neutrophils Relative % 49.4 %   Total Lymphocyte 41.4 %   Monocytes Relative 6.4 %   Eosinophils Relative 2.5 %   Basophils Relative 0.3 %  COMPLETE METABOLIC PANEL WITH GFR  Result Value Ref Range   Glucose, Bld 94 65 - 99 mg/dL   BUN 15 7 - 25 mg/dL   Creat 4.43 1.54 - 0.08 mg/dL   GFR, Est Non African American 80 > OR = 60 mL/min/1.4m2   GFR, Est African American 93 > OR = 60 mL/min/1.53m2   BUN/Creatinine Ratio NOT APPLICABLE 6 - 22 (calc)   Sodium 139 135 - 146 mmol/L   Potassium 4.3 3.5 - 5.3 mmol/L   Chloride 107 98 - 110 mmol/L   CO2 26 20 - 32 mmol/L   Calcium 9.3 8.6 - 10.3 mg/dL   Total Protein 6.8 6.1 - 8.1 g/dL   Albumin 4.3 3.6 - 5.1 g/dL   Globulin 2.5 1.9 - 3.7 g/dL (calc)   AG Ratio 1.7 1.0 - 2.5 (calc)   Total Bilirubin 0.7 0.2 - 1.2 mg/dL   Alkaline phosphatase (APISO) 27 (L) 36 - 130 U/L   AST 53 (H) 10 - 40 U/L   ALT 39 9 - 46 U/L  Lipid panel  Result Value Ref Range   Cholesterol 157 <200 mg/dL   HDL 56 > OR = 40 mg/dL   Triglycerides 39 <676 mg/dL   LDL Cholesterol (Calc) 90 mg/dL (calc)   Total CHOL/HDL Ratio 2.8 <5.0 (calc)   Non-HDL Cholesterol (Calc) 101 <130 mg/dL (calc)  HIV Antibody (routine testing w rflx)  Result Value Ref Range   HIV 1&2 Ab, 4th Generation NON-REACTIVE NON-REACTI  SAR CoV2 Serology (COVID 19)AB(IGG)IA  Result Value Ref Range   SARS CoV2 AB IGG NEGATIVE       Assessment & Plan:   Problem List Items Addressed This Visit    Essential hypertension - Primary    Significantly improved HTN control now Lifestyle improved, reduced sodium, inc exercise, reduced alcohol Not on medication, has had avg controlled readings Outside BP at work reviewed No known  complications - history of elevated Creatinine - ACEi intolerance cough / prior on amlodipine - Concern with history of alcohol  consumption, now significantly reduced  Plan:  1. Discussion on HTN management again - we agree to defer again another 5 months approximately, his BP is now in normal range OFF medication. - Next med trial if indicated would be Amlodipine 5mg  approx if indicated but not at this time. 2. Encourage improved lifestyle - low sodium diet, regular exercise 3. Continue monitor BP outside office, bring readings to next visit, if persistently >140/90 or new symptoms notify office sooner      Alcohol dependence (HCC)    Improved Chronic issue Now reduced alcohol intake, no longer drinking daily, only 1-2x weekly Improved blood pressure         No orders of the defined types were placed in this encounter.     Follow up plan: Return in about 5 months (around 11/10/2020) for Follow-up BP check.11/12/2020, DO [facility] 05/31/2020, 8:29 AM

## 2020-05-31 NOTE — Assessment & Plan Note (Signed)
Improved Chronic issue Now reduced alcohol intake, no longer drinking daily, only 1-2x weekly Improved blood pressure

## 2020-05-31 NOTE — Patient Instructions (Addendum)
Thank you for coming to the office today.  Keep up the good work with lifestyle to keep blood pressure under control.  Check BP at work few times a month, if persistent elevated >140/90 then contact us sooner. Otherwise keep apt in December   Please schedule a Follow-up Appointment to: Return in about 5 months (around 11/10/2020) for Follow-up BP check..  If you have any other questions or concerns, please feel free to call the office or send a message through MyChart. You may also schedule an earlier appointment if necessary.  Additionally, you may be receiving a survey about your experience at our office within a few days to 1 week by e-mail or mail. We value your feedback.  Saralyn Pilar, DO [facility], [place]

## 2020-05-31 NOTE — Assessment & Plan Note (Signed)
Significantly improved HTN control now Lifestyle improved, reduced sodium, inc exercise, reduced alcohol Not on medication, has had avg controlled readings Outside BP at work reviewed No known complications - history of elevated Creatinine - ACEi intolerance cough / prior on amlodipine - Concern with history of alcohol consumption, now significantly reduced  Plan:  1. Discussion on HTN management again - we agree to defer again another 5 months approximately, his BP is now in normal range OFF medication. - Next med trial if indicated would be Amlodipine 5mg  approx if indicated but not at this time. 2. Encourage improved lifestyle - low sodium diet, regular exercise 3. Continue monitor BP outside office, bring readings to next visit, if persistently >140/90 or new symptoms notify office sooner

## 2020-11-10 ENCOUNTER — Ambulatory Visit (INDEPENDENT_AMBULATORY_CARE_PROVIDER_SITE_OTHER): Payer: Commercial Managed Care - PPO | Admitting: Family Medicine

## 2020-11-10 ENCOUNTER — Encounter: Payer: Self-pay | Admitting: Family Medicine

## 2020-11-10 ENCOUNTER — Other Ambulatory Visit: Payer: Self-pay

## 2020-11-10 VITALS — BP 123/88 | HR 67 | Temp 97.5°F | Resp 16 | Ht 69.0 in | Wt 163.0 lb

## 2020-11-10 DIAGNOSIS — I10 Essential (primary) hypertension: Secondary | ICD-10-CM

## 2020-11-10 DIAGNOSIS — J3089 Other allergic rhinitis: Secondary | ICD-10-CM | POA: Diagnosis not present

## 2020-11-10 DIAGNOSIS — R590 Localized enlarged lymph nodes: Secondary | ICD-10-CM | POA: Diagnosis not present

## 2020-11-10 MED ORDER — FLUTICASONE PROPIONATE 50 MCG/ACT NA SUSP: 2.0000 | Freq: Every day | NASAL | 3 refills | Status: DC

## 2020-11-10 NOTE — Progress Notes (Signed)
Subjective:    Patient ID: Carl Rice, male    DOB: Apr 02, 1981, 39 y.o.   MRN: [id number]  Carl Rice is a 39 y.o. male presenting on 11/10/2020 for Hypertension and Sore Throat (Onset month as per pt had knot which went down but now started feeling pain after stop smoking --mucus )   HPI    CHRONIC HTN/ BMI >24 / Alcohol Dependence in remission - Last visit with me 05/2020, for same problem, treated with lifestyle management without medicine, see prior notes for background information. - Interval update with BP readings at work avg 130-140 range, and DBP with 80-90s. With mostly lifestyle improvement reduced sodium, exercise, reduced alcohol - Today patient reports no new concerns. Current Meds -none - Past medication Lisinopril - caused cough side effect Lifestyle: - Diet:Now improved diet, on low sodium diet. Drinks water, no sodas. - Exercise: has resumed exercise regimen Fam history of HTN. REDUCED - Admits drinking alcohol now 1-2x per week with reduced amount, previously a few a day. He can go several days without drinkingwithout complications of withdrawal Denies CP, dyspnea, HA, edema, dizziness / lightheadedness  Left Neck Knot vs Swelling / Pain Reports x 2 episodes in past 1 year, with localized left sided "knot" or swelling that has caused throbbing. First onset during summer 2022 swollen and painful for a few days 4-5 then it resolved, he felt normal at the time was not sick without other symptoms. Now recurrence same location with swelling, tenderness and throbbing similar to before onset 1 month ago now without any known trigger or illness, has not had URI or infection or sinuses or allergies.  Former Smoker History of Marijuana He still has some phlegm and thicker drainage.  Health Maintenance: UTD on COVID Booster Pfizer 2 weeks ago, completed, will bring copy of card in future. Due for Flu Shot, declines today despite counseling on benefits   Depression  screen [facility] 2/9 05/31/2020 02/19/2020 02/11/2020  Decreased Interest 0 0 0  Down, Depressed, Hopeless 0 0 0  PHQ - 2 Score 0 0 0  Altered sleeping - - -  Tired, decreased energy - - -  Change in appetite - - -  Feeling bad or failure about yourself  - - -  Trouble concentrating - - -  Moving slowly or fidgety/restless - - -  Suicidal thoughts - - -  PHQ-9 Score - - -    Social History   Tobacco Use  . Smoking status: Former Games developer  . Smokeless tobacco: Former Engineer, water Use Topics  . Alcohol use: Yes    Alcohol/week: 0.0 standard drinks  . Drug use: Not Currently    Types: Marijuana    Comment: quit 09/2020    Review of Systems Per HPI unless specifically indicated above     Objective:    BP 123/88   Pulse 67   Temp (!) 97.5 F (36.4 C) (Temporal)   Resp 16   Ht 5\' 9"  (1.753 m)   Wt 163 lb (73.9 kg)   SpO2 100%   BMI 24.07 kg/m   Wt Readings from Last 3 Encounters:  11/10/20 163 lb (73.9 kg)  05/31/20 166 lb 9.6 oz (75.6 kg)  02/19/20 170 lb (77.1 kg)    Physical Exam Vitals and nursing note reviewed.  Constitutional:      General: He is not in acute distress.    Appearance: He is well-developed and well-nourished. He is not diaphoretic.     Comments: Well-appearing,  comfortable, cooperative  HENT:     Head: Normocephalic and atraumatic.     Mouth/Throat:     Mouth: Oropharynx is clear and moist.  Eyes:     General:        Right eye: No discharge.        Left eye: No discharge.     Conjunctiva/sclera: Conjunctivae normal.  Neck:     Thyroid: No thyromegaly.  Cardiovascular:     Rate and Rhythm: Normal rate and regular rhythm.     Pulses: Intact distal pulses.     Heart sounds: Normal heart sounds. No murmur heard.   Pulmonary:     Effort: Pulmonary effort is normal. No respiratory distress.     Breath sounds: Normal breath sounds. No wheezing or rales.  Chest:  Breasts:     Right: No axillary adenopathy or supraclavicular adenopathy.      Left: No axillary adenopathy or supraclavicular adenopathy.    Musculoskeletal:        General: No edema. Normal range of motion.     Cervical back: Normal range of motion and neck supple.  Lymphadenopathy:     Cervical: Cervical adenopathy (mild localized left anterior upper cervical lymphadenopathy, very minimal seems improved. no other locations) present.     Right cervical: No posterior cervical adenopathy.    Left cervical: No posterior cervical adenopathy.     Upper Body:     Right upper body: No supraclavicular, axillary or pectoral adenopathy.     Left upper body: No supraclavicular, axillary or pectoral adenopathy.  Skin:    General: Skin is warm and dry.     Findings: No erythema or rash.  Neurological:     Mental Status: He is alert and oriented to person, place, and time.  Psychiatric:        Mood and Affect: Mood and affect normal.        Behavior: Behavior normal.     Comments: Well groomed, good eye contact, normal speech and thoughts       Results for orders placed or performed in visit on 02/19/20  Hemoglobin A1c  Result Value Ref Range   Hgb A1c MFr Bld 5.2 <5.7 % of total Hgb   Mean Plasma Glucose 103 (calc)   eAG (mmol/L) 5.7 (calc)  CBC with Differential/Platelet  Result Value Ref Range   WBC 3.6 (L) 3.8 - 10.8 Thousand/uL   RBC 5.75 4.20 - 5.80 Million/uL   Hemoglobin 14.0 13.2 - 17.1 g/dL   HCT 28.7 86.7 - 67.2 %   MCV 76.2 (L) 80.0 - 100.0 fL   MCH 24.3 (L) 27.0 - 33.0 pg   MCHC 32.0 32.0 - 36.0 g/dL   RDW 09.4 70.9 - 62.8 %   Platelets 204 140 - 400 Thousand/uL   MPV 12.0 7.5 - 12.5 fL   Neutro Abs 1,778 1,500 - 7,800 cells/uL   Lymphs Abs 1,490 850 - 3,900 cells/uL   Absolute Monocytes 230 200 - 950 cells/uL   Eosinophils Absolute 90 15 - 500 cells/uL   Basophils Absolute 11 0 - 200 cells/uL   Neutrophils Relative % 49.4 %   Total Lymphocyte 41.4 %   Monocytes Relative 6.4 %   Eosinophils Relative 2.5 %   Basophils Relative 0.3 %   COMPLETE METABOLIC PANEL WITH GFR  Result Value Ref Range   Glucose, Bld 94 65 - 99 mg/dL   BUN 15 7 - 25 mg/dL   Creat 3.66 2.94 - 7.65 mg/dL  GFR, Est Non African American 80 > OR = 60 mL/min/1.60m2   GFR, Est African American 93 > OR = 60 mL/min/1.47m2   BUN/Creatinine Ratio NOT APPLICABLE 6 - 22 (calc)   Sodium 139 135 - 146 mmol/L   Potassium 4.3 3.5 - 5.3 mmol/L   Chloride 107 98 - 110 mmol/L   CO2 26 20 - 32 mmol/L   Calcium 9.3 8.6 - 10.3 mg/dL   Total Protein 6.8 6.1 - 8.1 g/dL   Albumin 4.3 3.6 - 5.1 g/dL   Globulin 2.5 1.9 - 3.7 g/dL (calc)   AG Ratio 1.7 1.0 - 2.5 (calc)   Total Bilirubin 0.7 0.2 - 1.2 mg/dL   Alkaline phosphatase (APISO) 27 (L) 36 - 130 U/L   AST 53 (H) 10 - 40 U/L   ALT 39 9 - 46 U/L  Lipid panel  Result Value Ref Range   Cholesterol 157 <200 mg/dL   HDL 56 > OR = 40 mg/dL   Triglycerides 39 <956 mg/dL   LDL Cholesterol (Calc) 90 mg/dL (calc)   Total CHOL/HDL Ratio 2.8 <5.0 (calc)   Non-HDL Cholesterol (Calc) 101 <130 mg/dL (calc)  HIV Antibody (routine testing w rflx)  Result Value Ref Range   HIV 1&2 Ab, 4th Generation NON-REACTIVE NON-REACTI  SAR CoV2 Serology (COVID 19)AB(IGG)IA  Result Value Ref Range   SARS CoV2 AB IGG NEGATIVE       Assessment & Plan:   Problem List Items Addressed This Visit    Essential hypertension    Significantly improved HTN control now Lifestyle improved, reduced sodium, inc exercise, reduced alcohol Not on medication, has had avg controlled readings Outside BP at work reviewed No known complications - history of elevated Creatinine - ACEi intolerance cough / prior on amlodipine - Concern with history of alcohol consumption, now significantly reduced  Plan:  1. Discussion on HTN management again - we agree to defer again off medication 2. Encourage improved lifestyle - low sodium diet, regular exercise 3. Continue monitor BP outside office, bring readings to next visit, if persistently >140/90 or new  symptoms notify office sooner  If elevated BP in future reconsider Amlodipine trial       Other Visit Diagnoses    LAD (lymphadenopathy), anterior cervical    -  Primary   Relevant Orders   US Soft Tissue Head/Neck (NON-THYROID)   Seasonal allergic rhinitis due to other allergic trigger       Relevant Medications   fluticasone (FLONASE) 50 MCG/ACT nasal spray     Suspected benign isolated subacute nearly 4 weeks L superior cervical LAD isolated location, has had recurrence previous 05/2020 No signs of infection or other focal cause He used to smoke marijuana and has had some mucus sinus congestion but no infection, now former smoker No other concerning features  Plan: 1. Proceed with Neck Ultrasound for possible LAD for reassurance, will follow up results soon Monitor for change, inc size, pain, swelling, other LAD, worsening symptoms, return criteria given Future if unresolved consider referral to ENT or if abnormal ultrasound    Orders Placed This Encounter  Procedures  . US Soft Tissue Head/Neck (NON-THYROID)    Standing Status:   Future    Standing Expiration Date:   11/10/2021    Order Specific Question:   Reason for Exam (SYMPTOM  OR DIAGNOSIS REQUIRED)    Answer:   left neck anterior upper cervical lymphadenopathy >1 month, repeat episode    Order Specific Question:   Preferred imaging  location?    Answer:   [facility]      Meds ordered this encounter  Medications  . fluticasone (FLONASE) 50 MCG/ACT nasal spray    Sig: Place 2 sprays into both nostrils daily. Use for 4-6 weeks then stop and use seasonally or as needed.    Dispense:  16 g    Refill:  3      Follow up plan: Return in about 4 months (around 03/11/2021) for 4 month Annual Physical (fasting lab AFTER apt).   Saralyn PilarAlexander Sherae Santino, DO [facility] 11/10/2020, 8:17 AM

## 2020-11-10 NOTE — Patient Instructions (Addendum)
Thank you for coming to the office today.  Stay tuned for appointment for Ultrasound of Neck - likely a lymph node, it can be a normal reaction, hopefully it keeps improving. If it keeps coming back we can refer to ENT specialist.  Keep track of BP.  Start nasal steroid Flonase 2 sprays in each nostril daily for 4-6 weeks, may repeat course seasonally or as needed  AT NEXT APT - DUE for FASTING BLOOD WORK (no food or drink after midnight before the lab appointment, only water or coffee without cream/sugar on the morning of)   For Lab Results, once available within 2-3 days of blood draw, you can can log in to MyChart online to view your results and a brief explanation. Also, we can discuss results at next follow-up visit.   Please schedule a Follow-up Appointment to: Return in about 4 months (around 03/11/2021) for 4 month Annual Physical (fasting lab AFTER apt).  If you have any other questions or concerns, please feel free to call the office or send a message through MyChart. You may also schedule an earlier appointment if necessary.  Additionally, you may be receiving a survey about your experience at our office within a few days to 1 week by e-mail or mail. We value your feedback.  Saralyn Pilar, DO [facility], [place]

## 2020-11-10 NOTE — Assessment & Plan Note (Signed)
Significantly improved HTN control now Lifestyle improved, reduced sodium, inc exercise, reduced alcohol Not on medication, has had avg controlled readings Outside BP at work reviewed No known complications - history of elevated Creatinine - ACEi intolerance cough / prior on amlodipine - Concern with history of alcohol consumption, now significantly reduced  Plan:  1. Discussion on HTN management again - we agree to defer again off medication 2. Encourage improved lifestyle - low sodium diet, regular exercise 3. Continue monitor BP outside office, bring readings to next visit, if persistently >140/90 or new symptoms notify office sooner  If elevated BP in future reconsider Amlodipine trial

## 2020-11-13 ENCOUNTER — Other Ambulatory Visit: Payer: Self-pay

## 2020-11-13 ENCOUNTER — Emergency Department
Admission: EM | Admit: 2020-11-13 | Discharge: 2020-11-13 | Disposition: A | Discharge status: Home / Self Care | Payer: Commercial Managed Care - PPO | Attending: Emergency Medicine | Admitting: Emergency Medicine

## 2020-11-13 DIAGNOSIS — Z20822 Contact with and (suspected) exposure to covid-19: Secondary | ICD-10-CM | POA: Insufficient documentation

## 2020-11-13 DIAGNOSIS — J020 Streptococcal pharyngitis: Secondary | ICD-10-CM | POA: Diagnosis not present

## 2020-11-13 DIAGNOSIS — Z87891 Personal history of nicotine dependence: Secondary | ICD-10-CM | POA: Diagnosis not present

## 2020-11-13 DIAGNOSIS — R070 Pain in throat: Secondary | ICD-10-CM | POA: Diagnosis present

## 2020-11-13 LAB — RESP PANEL BY RT-PCR (FLU A&B, COVID) ARPGX2
Influenza A by PCR: NEGATIVE
Influenza B by PCR: NEGATIVE
SARS Coronavirus 2 by RT PCR: NEGATIVE

## 2020-11-13 LAB — GROUP A STREP BY PCR: Group A Strep by PCR: DETECTED — AB

## 2020-11-13 MED ORDER — PENICILLIN G BENZATHINE & PROC 1200000 UNIT/2ML IM SUSP
1.2000 10*6.[IU] | Freq: Once | INTRAMUSCULAR | Status: AC
  Administered 2020-11-13: 1.2 10*6.[IU] via INTRAMUSCULAR
  Filled 2020-11-13: qty 2

## 2020-11-13 MED ORDER — DEXAMETHASONE 10 MG/ML FOR PEDIATRIC ORAL USE
10.0000 mg | Freq: Once | INTRAMUSCULAR | Status: AC
  Administered 2020-11-13: 04:00:00 10 mg via ORAL
  Filled 2020-11-13: qty 1

## 2020-11-13 MED ORDER — MAGIC MOUTHWASH
10.0000 mL | Freq: Once | ORAL | Status: AC
  Administered 2020-11-13: 10 mL via ORAL
  Filled 2020-11-13: qty 10

## 2020-11-13 MED ORDER — MAGIC MOUTHWASH: 5.0000 mL | Freq: Three times a day (TID) | ORAL | 0 refills | Status: DC | PRN

## 2020-11-13 NOTE — Discharge Instructions (Signed)
You may take Magic mouthwash as needed for throat discomfort.  Return to the ER for worsening symptoms, persistent vomiting, difficulty breathing or other concerns.

## 2020-11-13 NOTE — ED Triage Notes (Signed)
Pt states sore throat for two days. Pt denies fever, cough or known covid exposure

## 2020-11-13 NOTE — ED Provider Notes (Signed)
[facility] Emergency Department Provider Note   ____________________________________________   Event Date/Time   First MD Initiated Contact with Patient 11/13/20 0244     (approximate)  I have reviewed the triage vital signs and the nursing notes.   HISTORY  Chief Complaint Sore Throat    HPI Carl Rice is a 39 y.o. male who presents to the ED from home with a chief complaint of sore throat.  Patient reports a 2-day history of sore throat.  Denies associated fever, cough, chest pain, shortness of breath, abdominal pain, nausea/vomiting.  Denies Covid exposure.     Past Medical History:  Diagnosis Date  . Genital herpes     Patient Active Problem List   Diagnosis Date Noted  . Alcohol dependence (HCC) 12/04/2018  . Gastroesophageal reflux disease 12/04/2018  . Genital herpes 08/24/2015  . Essential hypertension 08/24/2015    No past surgical history on file.  Prior to Admission medications   Medication Sig Start Date End Date Taking? Authorizing Provider  fluticasone (FLONASE) 50 MCG/ACT nasal spray Place 2 sprays into both nostrils daily. Use for 4-6 weeks then stop and use seasonally or as needed. 11/10/20   Karamalegos, Netta Neat, DO  magic mouthwash SOLN Take 5 mLs by mouth 3 (three) times daily as needed for mouth pain. 11/13/20   Irean Hong, MD  omeprazole (PRILOSEC) 40 MG capsule TAKE 1 CAPSULE BY MOUTH EVERY DAY 02/25/19   Karamalegos, Netta Neat, DO  sucralfate (CARAFATE) 1 g tablet Take 1 tablet (1 g total) by mouth 4 (four) times daily -  with meals and at bedtime. As needed 12/04/18   Smitty Cords, DO  valACYclovir (VALTREX) 500 MG tablet Take 1 tablet (500 mg total) by mouth 2 (two) times daily. Take for 5 days when needed. 02/11/20   Karamalegos, Netta Neat, DO    Allergies Lisinopril  Family History  Problem Relation Age of Onset  . Hypertension Mother   . Drug abuse Mother   . Drug abuse Father   .  Hypertension Father   . Kidney disease Son     Social History Social History   Tobacco Use  . Smoking status: Former Games developer  . Smokeless tobacco: Former Engineer, water Use Topics  . Alcohol use: Yes    Alcohol/week: 0.0 standard drinks  . Drug use: Not Currently    Types: Marijuana    Comment: quit 09/2020    Review of Systems  Constitutional: No fever/chills Eyes: No visual changes. ENT: Positive for sore throat. Cardiovascular: Denies chest pain. Respiratory: Denies shortness of breath. Gastrointestinal: No abdominal pain.  No nausea, no vomiting.  No diarrhea.  No constipation. Genitourinary: Negative for dysuria. Musculoskeletal: Negative for back pain. Skin: Negative for rash. Neurological: Negative for headaches, focal weakness or numbness.   ____________________________________________   PHYSICAL EXAM:  VITAL SIGNS: ED Triage Vitals  Enc Vitals Group     BP 11/13/20 0123 (!) 156/102     Pulse Rate 11/13/20 0123 86     Resp 11/13/20 0123 14     Temp 11/13/20 0123 98.5 F (36.9 C)     Temp src --      SpO2 11/13/20 0123 100 %     Weight 11/13/20 0120 165 lb (74.8 kg)     Height 11/13/20 0120 5\' 9"  (1.753 m)     Head Circumference --      Peak Flow --      Pain Score 11/13/20 0120 8  Pain Loc --      Pain Edu? --      Excl. in GC? --     Constitutional: Alert and oriented. Well appearing and in no acute distress. Eyes: Conjunctivae are normal. PERRL. EOMI. Head: Atraumatic. Nose: No congestion/rhinnorhea. Mouth/Throat: Mucous membranes are moist.  Oropharynx moderately erythematous without significant tonsillar swelling, exudates or peritonsillar abscess.  There is no hoarse or muffled voice.  There is no drooling. Neck: No stridor.   Hematological/Lymphatic/Immunilogical: Shoddy anterior cervical lymphadenopathy. Cardiovascular: Normal rate, regular rhythm. Grossly normal heart sounds.  Good peripheral circulation. Respiratory: Normal  respiratory effort.  No retractions. Lungs CTAB. Gastrointestinal: Soft and nontender. No distention. No abdominal bruits. No CVA tenderness. Musculoskeletal: No lower extremity tenderness nor edema.  No joint effusions. Neurologic:  Normal speech and language. No gross focal neurologic deficits are appreciated. No gait instability. Skin:  Skin is warm, dry and intact. No rash noted. Psychiatric: Mood and affect are normal. Speech and behavior are normal.  ____________________________________________   LABS (all labs ordered are listed, but only abnormal results are displayed)  Labs Reviewed  GROUP A STREP BY PCR - Abnormal; Notable for the following components:      Result Value   Group A Strep by PCR DETECTED (*)    All other components within normal limits  RESP PANEL BY RT-PCR (FLU A&B, COVID) [id number]   ____________________________________________  EKG  None ____________________________________________  RADIOLOGY I, Tehilla Coffel J, personally viewed and evaluated these images (plain radiographs) as part of my medical decision making, as well as reviewing the written report by the radiologist.  ED MD interpretation: None  Official radiology report(s): No results found.  ____________________________________________   PROCEDURES  Procedure(s) performed (including Critical Care):  Procedures   ____________________________________________   INITIAL IMPRESSION / ASSESSMENT AND PLAN / ED COURSE  As part of my medical decision making, I reviewed the following data within the electronic MEDICAL RECORD NUMBER Nursing notes reviewed and incorporated, Labs reviewed and Notes from prior ED visits     39 year old male presenting with sore throat.  Rapid strep is positive.  Patient opts for IM antibiotic.  Will administer IM Bicillin, oral Decadron, Magic mouthwash.  Strict return precautions given.  Patient verbalizes understanding agrees with plan of care.       ____________________________________________   FINAL CLINICAL IMPRESSION(S) / ED DIAGNOSES  Final diagnoses:  Strep throat     ED Discharge Orders         Ordered    magic mouthwash SOLN  3 times daily PRN        11/13/20 0254          *Please note:  Carl Rice was evaluated in Emergency Department on 11/13/2020 for the symptoms described in the history of present illness. He was evaluated in the context of the global COVID-19 pandemic, which necessitated consideration that the patient might be at risk for infection with the SARS-CoV-2 virus that causes COVID-19. Institutional protocols and algorithms that pertain to the evaluation of patients at risk for COVID-19 are in a state of rapid change based on information released by regulatory bodies including the CDC and federal and state organizations. These policies and algorithms were followed during the patient's care in the ED.  Some ED evaluations and interventions may be delayed as a result of limited staffing during and the pandemic.*   Note:  This document was prepared using Dragon voice recognition software and may include unintentional dictation errors.   Irean Hong,  MD 11/13/20 [phone]

## 2020-11-22 ENCOUNTER — Ambulatory Visit: Payer: Commercial Managed Care - PPO

## 2021-02-01 ENCOUNTER — Other Ambulatory Visit: Payer: Self-pay | Admitting: Family Medicine

## 2021-02-01 DIAGNOSIS — J3089 Other allergic rhinitis: Secondary | ICD-10-CM

## 2021-03-01 ENCOUNTER — Ambulatory Visit: Payer: Commercial Managed Care - PPO | Admitting: Family Medicine

## 2021-03-02 ENCOUNTER — Encounter: Payer: Self-pay | Admitting: Family Medicine

## 2021-03-02 ENCOUNTER — Other Ambulatory Visit: Payer: Self-pay

## 2021-03-02 ENCOUNTER — Ambulatory Visit (INDEPENDENT_AMBULATORY_CARE_PROVIDER_SITE_OTHER): Payer: Commercial Managed Care - PPO | Admitting: Family Medicine

## 2021-03-02 VITALS — BP 144/90 | HR 61 | Ht 69.0 in | Wt 177.6 lb

## 2021-03-02 DIAGNOSIS — Z0000 Encounter for general adult medical examination without abnormal findings: Secondary | ICD-10-CM | POA: Diagnosis not present

## 2021-03-02 DIAGNOSIS — Z1159 Encounter for screening for other viral diseases: Secondary | ICD-10-CM | POA: Diagnosis not present

## 2021-03-02 DIAGNOSIS — I10 Essential (primary) hypertension: Secondary | ICD-10-CM | POA: Diagnosis not present

## 2021-03-02 DIAGNOSIS — R7309 Other abnormal glucose: Secondary | ICD-10-CM

## 2021-03-02 DIAGNOSIS — F1020 Alcohol dependence, uncomplicated: Secondary | ICD-10-CM

## 2021-03-02 DIAGNOSIS — A6002 Herpesviral infection of other male genital organs: Secondary | ICD-10-CM

## 2021-03-02 DIAGNOSIS — E663 Overweight: Secondary | ICD-10-CM | POA: Insufficient documentation

## 2021-03-02 MED ORDER — VALACYCLOVIR HCL 500 MG PO TABS: 500.0000 mg | ORAL_TABLET | Freq: Two times a day (BID) | ORAL | 5 refills | Status: DC

## 2021-03-02 NOTE — Assessment & Plan Note (Signed)
Stable w/o flare Refill Valtrex PRN

## 2021-03-02 NOTE — Assessment & Plan Note (Signed)
Reducd alcohol intake Doing well, not drinking daily, now limited amount 2 days a week No evidence of complicated withdrawal

## 2021-03-02 NOTE — Assessment & Plan Note (Signed)
Weight gain since last visit Lifestyle encouraged diet exercise

## 2021-03-02 NOTE — Patient Instructions (Addendum)
Thank you for coming to the office today.  Try to keep improving diet. And lifestyle.  Refileld Valtrex.  We may reconsider medication next time.   Please schedule a Follow-up Appointment to: Return in about 4 months (around 07/14/2021) for 4 month follow-up on 8/25 for HTN.  If you have any other questions or concerns, please feel free to call the office or send a message through MyChart. You may also schedule an earlier appointment if necessary.  Additionally, you may be receiving a survey about your experience at our office within a few days to 1 week by e-mail or mail. We value your feedback.  Saralyn Pilar, DO [facility], [facility]   [email or web address]  DASH Eating Plan DASH stands for Dietary Approaches to Stop Hypertension. The DASH eating plan is a healthy eating plan that has been shown to:  Reduce high blood pressure (hypertension).  Reduce your risk for type 2 diabetes, heart disease, and stroke.  Help with weight loss. What are tips for following this plan? Reading food labels  Check food labels for the amount of salt (sodium) per serving. Choose foods with less than 5 percent of the Daily Value of sodium. Generally, foods with less than 300 milligrams (mg) of sodium per serving fit into this eating plan.  To find whole grains, look for the word "whole" as the first word in the ingredient list. Shopping  Buy products labeled as "low-sodium" or "no salt added."  Buy fresh foods. Avoid canned foods and pre-made or frozen meals. Cooking  Avoid adding salt when cooking. Use salt-free seasonings or herbs instead of table salt or sea salt. Check with your health care provider or pharmacist before using salt substitutes.  Do not fry foods. Cook foods using healthy methods such as baking, boiling, grilling, roasting, and broiling instead.  Cook with heart-healthy oils, such as olive, canola, avocado,  soybean, or sunflower oil. Meal planning  Eat a balanced diet that includes: ? 4 or more servings of fruits and 4 or more servings of vegetables each day. Try to fill one-half of your plate with fruits and vegetables. ? 6-8 servings of whole grains each day. ? Less than 6 oz (170 g) of lean meat, poultry, or fish each day. A 3-oz (85-g) serving of meat is about the same size as a deck of cards. One egg equals 1 oz (28 g). ? 2-3 servings of low-fat dairy each day. One serving is 1 cup (237 mL). ? 1 serving of nuts, seeds, or beans 5 times each week. ? 2-3 servings of heart-healthy fats. Healthy fats called omega-3 fatty acids are found in foods such as walnuts, flaxseeds, fortified milks, and eggs. These fats are also found in cold-water fish, such as sardines, salmon, and mackerel.  Limit how much you eat of: ? Canned or prepackaged foods. ? Food that is high in trans fat, such as some fried foods. ? Food that is high in saturated fat, such as fatty meat. ? Desserts and other sweets, sugary drinks, and other foods with added sugar. ? Full-fat dairy products.  Do not salt foods before eating.  Do not eat more than 4 egg yolks a week.  Try to eat at least 2 vegetarian meals a week.  Eat more home-cooked food and less restaurant, buffet, and fast food.   Lifestyle  When eating at a restaurant, ask that your food be prepared with less salt or no salt, if possible.  If you drink alcohol: ? Limit  how much you use to:  0-1 drink a day for women who are not pregnant.  0-2 drinks a day for men. ? Be aware of how much alcohol is in your drink. In the [place] one drink equals one 12 oz bottle of beer (355 mL), one 5 oz glass of wine (148 mL), or one 1 oz glass of hard liquor (44 mL). General information  Avoid eating more than 2,300 mg of salt a day. If you have hypertension, you may need to reduce your sodium intake to 1,500 mg a day.  Work with your health care provider to maintain a  healthy body weight or to lose weight. Ask what an ideal weight is for you.  Get at least 30 minutes of exercise that causes your heart to beat faster (aerobic exercise) most days of the week. Activities may include walking, swimming, or biking.  Work with your health care provider or dietitian to adjust your eating plan to your individual calorie needs. What foods should I eat? Fruits All fresh, dried, or frozen fruit. Canned fruit in natural juice (without added sugar). Vegetables Fresh or frozen vegetables (raw, steamed, roasted, or grilled). Low-sodium or reduced-sodium tomato and vegetable juice. Low-sodium or reduced-sodium tomato sauce and tomato paste. Low-sodium or reduced-sodium canned vegetables. Grains Whole-grain or whole-wheat bread. Whole-grain or whole-wheat pasta. Brown rice. Orpah Cobb. Bulgur. Whole-grain and low-sodium cereals. Pita bread. Low-fat, low-sodium crackers. Whole-wheat flour tortillas. Meats and other proteins Skinless chicken or [place]. Ground chicken or [place]. Pork with fat trimmed off. Fish and seafood. Egg whites. Dried beans, peas, or lentils. Unsalted nuts, nut butters, and seeds. Unsalted canned beans. Lean cuts of beef with fat trimmed off. Low-sodium, lean precooked or cured meat, such as sausages or meat loaves. Dairy Low-fat (1%) or fat-free (skim) milk. Reduced-fat, low-fat, or fat-free cheeses. Nonfat, low-sodium ricotta or cottage cheese. Low-fat or nonfat yogurt. Low-fat, low-sodium cheese. Fats and oils Soft margarine without trans fats. Vegetable oil. Reduced-fat, low-fat, or light mayonnaise and salad dressings (reduced-sodium). Canola, safflower, olive, avocado, soybean, and sunflower oils. Avocado. Seasonings and condiments Herbs. Spices. Seasoning mixes without salt. Other foods Unsalted popcorn and pretzels. Fat-free sweets. The items listed above may not be a complete list of foods and beverages you can eat. Contact a dietitian for  more information. What foods should I avoid? Fruits Canned fruit in a light or heavy syrup. Fried fruit. Fruit in cream or butter sauce. Vegetables Creamed or fried vegetables. Vegetables in a cheese sauce. Regular canned vegetables (not low-sodium or reduced-sodium). Regular canned tomato sauce and paste (not low-sodium or reduced-sodium). Regular tomato and vegetable juice (not low-sodium or reduced-sodium). Rosita Fire. Olives. Grains Baked goods made with fat, such as croissants, muffins, or some breads. Dry pasta or rice meal packs. Meats and other proteins Fatty cuts of meat. Ribs. Fried meat. Tomasa Blase. Bologna, salami, and other precooked or cured meats, such as sausages or meat loaves. Fat from the back of a pig (fatback). Bratwurst. Salted nuts and seeds. Canned beans with added salt. Canned or smoked fish. Whole eggs or egg yolks. Chicken or [place] with skin. Dairy Whole or 2% milk, cream, and half-and-half. Whole or full-fat cream cheese. Whole-fat or sweetened yogurt. Full-fat cheese. Nondairy creamers. Whipped toppings. Processed cheese and cheese spreads. Fats and oils Butter. Stick margarine. Lard. Shortening. Ghee. Bacon fat. Tropical oils, such as coconut, palm kernel, or palm oil. Seasonings and condiments Onion salt, garlic salt, seasoned salt, table salt, and sea salt. Worcestershire sauce. Tartar sauce.  Barbecue sauce. Teriyaki sauce. Soy sauce, including reduced-sodium. Steak sauce. Canned and packaged gravies. Fish sauce. Oyster sauce. Cocktail sauce. Store-bought horseradish. Ketchup. Mustard. Meat flavorings and tenderizers. Bouillon cubes. Hot sauces. Pre-made or packaged marinades. Pre-made or packaged taco seasonings. Relishes. Regular salad dressings. Other foods Salted popcorn and pretzels. The items listed above may not be a complete list of foods and beverages you should avoid. Contact a dietitian for more information. Where to find more information  National Heart,  Lung, and Blood Institute: PopSteam.is  American Heart Association: www.heart.org  Academy of Nutrition and Dietetics: www.eatright.org  National Kidney Foundation: www.kidney.org Summary  The DASH eating plan is a healthy eating plan that has been shown to reduce high blood pressure (hypertension). It may also reduce your risk for type 2 diabetes, heart disease, and stroke.  When on the DASH eating plan, aim to eat more fresh fruits and vegetables, whole grains, lean proteins, low-fat dairy, and heart-healthy fats.  With the DASH eating plan, you should limit salt (sodium) intake to 2,300 mg a day. If you have hypertension, you may need to reduce your sodium intake to 1,500 mg a day.  Work with your health care provider or dietitian to adjust your eating plan to your individual calorie needs. This information is not intended to replace advice given to you by your health care provider. Make sure you discuss any questions you have with your health care provider. Document Revised: 10/10/2019 Document Reviewed: 10/10/2019 Elsevier Patient Education  2021 [employer].

## 2021-03-02 NOTE — Progress Notes (Signed)
Subjective:    Patient ID: Carl Rice, male    DOB: 1980-12-17, 40 y.o.   MRN: [id number]  Carl Rice is a 40 y.o. male presenting on 03/02/2021 for Annual Exam and Hypertension   HPI   Here for Annual Physical and Lab Review.  CHRONIC HTN/ Overweight BMI >26 / Alcohol Dependence. Last visit in 2021 He has had work BP readings 120-130s avg, highest 142. See prior notes for background Current Meds -none - Past medication Lisinopril - caused cough side effect Lifestyle: - Diet: Has not adhered to regular diet, admits inc portions and not right foods. Admits increased sodium and caffeine - Exercise:resume gym work out soon Fam history of HTN. Admits drinking alcohol now 1-2x per week with reduced amount, to 8-12 oz seltzer can, occasional mixed drink He can go several days without drinkingwithout complications of withdrawal Denies CP, dyspnea, HA, edema, dizziness / lightheadedness  GERD Improved, no longer on PPI. Remains off doing well.  Recurrent Genital Herpes Refill Valtrex. Uses PRN.  Health Maintenance: COVID19 vaccine, Pfizer 02/2020 x 2 doses and Booster, will give [place] copy of card.  Depression screen [facility] 2/9 05/31/2020 02/19/2020 02/11/2020  Decreased Interest 0 0 0  Down, Depressed, Hopeless 0 0 0  PHQ - 2 Score 0 0 0  Altered sleeping - - -  Tired, decreased energy - - -  Change in appetite - - -  Feeling bad or failure about yourself  - - -  Trouble concentrating - - -  Moving slowly or fidgety/restless - - -  Suicidal thoughts - - -  PHQ-9 Score - - -    Past Medical History:  Diagnosis Date  . Genital herpes    History reviewed. No pertinent surgical history. Social History   Socioeconomic History  . Marital status: Single    Spouse name: Not on file  . Number of children: Not on file  . Years of education: Not on file  . Highest education level: Not on file  Occupational History  . Not on file  Tobacco Use  . Smoking status: Former Games developer   . Smokeless tobacco: Former Engineer, water and Sexual Activity  . Alcohol use: Yes    Alcohol/week: 0.0 standard drinks  . Drug use: Not Currently    Types: Marijuana    Comment: quit 09/2020  . Sexual activity: Not on file  Other Topics Concern  . Not on file  Social History Narrative  . Not on file   Social Determinants of Health   Financial Resource Strain: Not on file  Food Insecurity: Not on file  Transportation Needs: Not on file  Physical Activity: Not on file  Stress: Not on file  Social Connections: Not on file  Intimate Partner Violence: Not on file   Family History  Problem Relation Age of Onset  . Hypertension Mother   . Drug abuse Mother   . Drug abuse Father   . Hypertension Father   . Kidney disease Son    Current Outpatient Medications on File Prior to Visit  Medication Sig  . fluticasone (FLONASE) 50 MCG/ACT nasal spray PLACE 2 SPRAYS INTO BOTH NOSTRILS DAILY. USE FOR 4-6 WEEKS THEN STOP AND USE SEASONALLY OR AS NEEDED.   No current facility-administered medications on file prior to visit.    Review of Systems  Constitutional: Negative for activity change, appetite change, chills, diaphoresis, fatigue and fever.  HENT: Negative for congestion and hearing loss.   Eyes: Negative for visual disturbance.  Respiratory: Negative for cough, chest tightness, shortness of breath and wheezing.   Cardiovascular: Negative for chest pain, palpitations and leg swelling.  Gastrointestinal: Negative for abdominal pain, constipation, diarrhea, nausea and vomiting.  Endocrine: Negative for cold intolerance.  Genitourinary: Negative for dysuria, frequency and hematuria.  Musculoskeletal: Negative for arthralgias and neck pain.  Skin: Negative for rash.  Allergic/Immunologic: Negative for environmental allergies.  Neurological: Negative for dizziness, weakness, light-headedness, numbness and headaches.  Hematological: Negative for adenopathy.   Psychiatric/Behavioral: Negative for behavioral problems, dysphoric mood and sleep disturbance.   Per HPI unless specifically indicated above      Objective:    BP (!) 144/90 (BP Location: Left Arm, Cuff Size: Normal)   Pulse 61   Ht 5\' 9"  (1.753 m)   Wt 177 lb 9.6 oz (80.6 kg)   SpO2 100%   BMI 26.23 kg/m   Wt Readings from Last 3 Encounters:  03/02/21 177 lb 9.6 oz (80.6 kg)  11/13/20 165 lb (74.8 kg)  11/10/20 163 lb (73.9 kg)    Physical Exam Vitals and nursing note reviewed.  Constitutional:      General: He is not in acute distress.    Appearance: He is well-developed. He is not diaphoretic.     Comments: Well-appearing, comfortable, cooperative  HENT:     Head: Normocephalic and atraumatic.  Eyes:     General:        Right eye: No discharge.        Left eye: No discharge.     Conjunctiva/sclera: Conjunctivae normal.     Pupils: Pupils are equal, round, and reactive to light.  Neck:     Thyroid: No thyromegaly.  Cardiovascular:     Rate and Rhythm: Normal rate and regular rhythm.     Heart sounds: Normal heart sounds. No murmur heard.   Pulmonary:     Effort: Pulmonary effort is normal. No respiratory distress.     Breath sounds: Normal breath sounds. No wheezing or rales.  Abdominal:     General: Bowel sounds are normal. There is no distension.     Palpations: Abdomen is soft. There is no mass.     Tenderness: There is no abdominal tenderness.  Musculoskeletal:        General: No tenderness. Normal range of motion.     Cervical back: Normal range of motion and neck supple.     Comments: Upper / Lower Extremities: - Normal muscle tone, strength bilateral upper extremities 5/5, lower extremities 5/5  Lymphadenopathy:     Cervical: No cervical adenopathy.  Skin:    General: Skin is warm and dry.     Findings: No erythema or rash.  Neurological:     Mental Status: He is alert and oriented to person, place, and time.     Comments: Distal sensation  intact to light touch all extremities  Psychiatric:        Behavior: Behavior normal.     Comments: Well groomed, good eye contact, normal speech and thoughts        Results for orders placed or performed during the hospital encounter of 11/13/20  Group A Strep by PCR   Specimen: Nasopharyngeal Swab; Sterile Swab  Result Value Ref Range   Group A Strep by PCR DETECTED (A) NOT DETECTED  Resp Panel by RT-PCR (Flu A&B, Covid) Nasopharyngeal Swab   Specimen: Nasopharyngeal Swab; Nasopharyngeal(NP) swabs in vial transport medium  Result Value Ref Range   SARS Coronavirus 2 by RT PCR NEGATIVE  NEGATIVE   Influenza A by PCR NEGATIVE NEGATIVE   Influenza B by PCR NEGATIVE NEGATIVE      Assessment & Plan:   Problem List Items Addressed This Visit    Overweight (BMI 25.0-29.9)    Weight gain since last visit Lifestyle encouraged diet exercise      Genital herpes    Stable w/o flare Refill Valtrex PRN      Relevant Medications   valACYclovir (VALTREX) 500 MG tablet   Essential hypertension    Previously improved, now today elevated BP. Admits no sleep last night. Less adherent to lifestyle but goal to resume Not on medication, has had avg controlled readings Outside BP at work reviewed - mostly controlled, only 1 reading >140 No known complications - history of elevated Creatinine - ACEi intolerance cough / prior on amlodipine - Concern with history of alcohol consumption, now significantly reduced  Plan:  1. Discussion on HTN management again - we agree to defer again off medication 2. Encourage improved lifestyle - low sodium diet, regular exercise 3. Continue monitor BP outside office, bring readings to next visit, if persistently >140/90 or new symptoms notify office sooner  If elevated BP in future reconsider Amlodipine trial      Relevant Orders   CBC with Differential/Platelet   COMPLETE METABOLIC PANEL WITH GFR   TSH   Alcohol dependence (HCC)    Reducd alcohol  intake Doing well, not drinking daily, now limited amount 2 days a week No evidence of complicated withdrawal       Other Visit Diagnoses    Annual physical exam    -  Primary   Relevant Orders   Hemoglobin A1c   CBC with Differential/Platelet   COMPLETE METABOLIC PANEL WITH GFR   Lipid panel   Need for hepatitis C screening test       Relevant Orders   Hepatitis C antibody   Abnormal glucose       Relevant Orders   Hemoglobin A1c      Updated Health Maintenance information Fasting lab today, f/u results Encouraged improvement to lifestyle with diet and exercise Goal of weight loss  Orders Placed This Encounter  Procedures  . Hemoglobin A1c  . CBC with Differential/Platelet  . COMPLETE METABOLIC PANEL WITH GFR  . Lipid panel    Order Specific Question:   Has the patient fasted?    Answer:   Yes  . Hepatitis C antibody  . TSH     Meds ordered this encounter  Medications  . valACYclovir (VALTREX) 500 MG tablet    Sig: Take 1 tablet (500 mg total) by mouth 2 (two) times daily. Take for 5 days when needed.    Dispense:  20 tablet    Refill:  5     Follow up plan: Return in about 4 months (around 07/14/2021) for 4 month follow-up on 8/25 for HTN.  Saralyn Pilar, DO [facility] 03/02/2021, 8:15 AM

## 2021-03-02 NOTE — Assessment & Plan Note (Signed)
Previously improved, now today elevated BP. Admits no sleep last night. Less adherent to lifestyle but goal to resume Not on medication, has had avg controlled readings Outside BP at work reviewed - mostly controlled, only 1 reading >140 No known complications - history of elevated Creatinine - ACEi intolerance cough / prior on amlodipine - Concern with history of alcohol consumption, now significantly reduced  Plan:  1. Discussion on HTN management again - we agree to defer again off medication 2. Encourage improved lifestyle - low sodium diet, regular exercise 3. Continue monitor BP outside office, bring readings to next visit, if persistently >140/90 or new symptoms notify office sooner  If elevated BP in future reconsider Amlodipine trial

## 2021-03-03 LAB — LIPID PANEL
Cholesterol: 205 mg/dL — ABNORMAL HIGH (ref <200)
HDL: 91 mg/dL (ref >=40)
LDL Cholesterol (Calc): 101 mg/dL (calc) — ABNORMAL HIGH
Non-HDL Cholesterol (Calc): 114 mg/dL (calc) (ref <130)
Total CHOL/HDL Ratio: 2.3 (calc) (ref <5.0)
Triglycerides: 44 mg/dL (ref <150)

## 2021-03-03 LAB — COMPLETE METABOLIC PANEL WITH GFR
AG Ratio: 1.7 (calc) (ref 1.0–2.5)
ALT: 48 U/L — ABNORMAL HIGH (ref 9–46)
AST: 45 U/L — ABNORMAL HIGH (ref 10–40)
Albumin: 4.8 g/dL (ref 3.6–5.1)
Alkaline phosphatase (APISO): 30 U/L — ABNORMAL LOW (ref 36–130)
BUN: 12 mg/dL (ref 7–25)
CO2: 26 mmol/L (ref 20–32)
Calcium: 9.4 mg/dL (ref 8.6–10.3)
Chloride: 102 mmol/L (ref 98–110)
Creat: 1.14 mg/dL (ref 0.60–1.35)
GFR, Est African American: 93 mL/min/{1.73_m2} (ref >=60)
GFR, Est Non African American: 81 mL/min/{1.73_m2} (ref >=60)
Globulin: 2.9 g/dL (calc) (ref 1.9–3.7)
Glucose, Bld: 80 mg/dL (ref 65–99)
Potassium: 4.3 mmol/L (ref 3.5–5.3)
Sodium: 139 mmol/L (ref 135–146)
Total Bilirubin: 1.2 mg/dL (ref 0.2–1.2)
Total Protein: 7.7 g/dL (ref 6.1–8.1)

## 2021-03-03 LAB — CBC WITH DIFFERENTIAL/PLATELET
Absolute Monocytes: 322 cells/uL (ref 200–950)
Basophils Absolute: 19 cells/uL (ref 0–200)
Basophils Relative: 0.5 %
Eosinophils Absolute: 59 cells/uL (ref 15–500)
Eosinophils Relative: 1.6 %
HCT: 48.1 % (ref 38.5–50.0)
Hemoglobin: 15 g/dL (ref 13.2–17.1)
Lymphs Abs: 1436 cells/uL (ref 850–3900)
MCH: 24.3 pg — ABNORMAL LOW (ref 27.0–33.0)
MCHC: 31.2 g/dL — ABNORMAL LOW (ref 32.0–36.0)
MCV: 78 fL — ABNORMAL LOW (ref 80.0–100.0)
MPV: 11.9 fL (ref 7.5–12.5)
Monocytes Relative: 8.7 %
Neutro Abs: 1865 cells/uL (ref 1500–7800)
Neutrophils Relative %: 50.4 %
Platelets: 226 10*3/uL (ref 140–400)
RBC: 6.17 10*6/uL — ABNORMAL HIGH (ref 4.20–5.80)
RDW: 14 % (ref 11.0–15.0)
Total Lymphocyte: 38.8 %
WBC: 3.7 10*3/uL — ABNORMAL LOW (ref 3.8–10.8)

## 2021-03-03 LAB — TSH: TSH: 1.9 mIU/L (ref 0.40–4.50)

## 2021-03-03 LAB — HEPATITIS C ANTIBODY
Hepatitis C Ab: NONREACTIVE
SIGNAL TO CUT-OFF: 0.02 (ref <1.00)

## 2021-03-03 LAB — HEMOGLOBIN A1C
Hgb A1c MFr Bld: 5.3 % of total Hgb (ref <5.7)
Mean Plasma Glucose: 105 mg/dL
eAG (mmol/L): 5.8 mmol/L

## 2021-07-20 ENCOUNTER — Ambulatory Visit: Payer: Commercial Managed Care - PPO | Admitting: Family Medicine

## 2021-10-10 ENCOUNTER — Other Ambulatory Visit: Payer: Self-pay

## 2021-10-10 ENCOUNTER — Ambulatory Visit: Payer: Commercial Managed Care - PPO | Admitting: Family Medicine

## 2021-10-10 ENCOUNTER — Encounter: Payer: Self-pay | Admitting: Family Medicine

## 2021-10-10 VITALS — BP 154/105 | HR 63 | Ht 69.0 in | Wt 187.8 lb

## 2021-10-10 DIAGNOSIS — I10 Essential (primary) hypertension: Secondary | ICD-10-CM | POA: Diagnosis not present

## 2021-10-10 DIAGNOSIS — F1020 Alcohol dependence, uncomplicated: Secondary | ICD-10-CM

## 2021-10-10 DIAGNOSIS — Z23 Encounter for immunization: Secondary | ICD-10-CM | POA: Diagnosis not present

## 2021-10-10 MED ORDER — OLMESARTAN MEDOXOMIL 20 MG PO TABS: 20.0000 mg | ORAL_TABLET | Freq: Every day | ORAL | 2 refills | Status: DC

## 2021-10-10 NOTE — Patient Instructions (Addendum)
Thank you for coming to the office today.  Start Olmesartan (Benicar) 20mg  daily BP medication  Keep track of readings, goal < 140 / 90  May need to adjust the dose or add another low dose med to this one.  Return in 4 weeks for blood test  If you have any significant chest pain that does not go away within 30 minutes, is accompanied by nausea, sweating, shortness of breath, or made worse by activity, this may be evidence of a heart attack, especially if symptoms worsening instead of improving, please call 911 or go directly to the emergency room immediately for evaluation.   Please schedule a Follow-up Appointment to: Return in about 4 weeks (around 11/07/2021) for 4 week non fasting lab only BMET then 1 week later follow-up HTN, med.  If you have any other questions or concerns, please feel free to call the office or send a message through MyChart. You may also schedule an earlier appointment if necessary.  Additionally, you may be receiving a survey about your experience at our office within a few days to 1 week by e-mail or mail. We value your feedback.  11/09/2021, DO [facility], [facility]

## 2021-10-10 NOTE — Progress Notes (Signed)
Subjective:    Patient ID: Carl Rice, male    DOB: June 15, 1981, 41 y.o.   MRN: [id number]  Carl Rice is a 40 y.o. male presenting on 10/10/2021 for Hypertension   HPI  CHRONIC HTN / Overweight BMI >27 / Alcohol Dependence. Last visit in 02/2021 He has had out of office BP readings 120-140s / 80-90s Last 4-5 days he as away on a trip for friend's birthday and had a lot of alcohol by he admitted. He admits episodic atypical chest pains. He says sometimes worse with post-exercise at gym. See prior notes for background Current Meds - none - Past medication Lisinopril - caused cough side effect Lifestyle: Still goal to work on low sodium and diet - Exercise: resume gym work outs Fam history of HTN. Admits drinking alcohol usually 1-2 x per week, lately with vacation was drinking large amounts He can go several days without drinking without complications of withdrawal Denies CP, dyspnea, HA, edema, dizziness / lightheadedness    Depression screen [facility] 2/9 10/10/2021 05/31/2020 02/19/2020  Decreased Interest 0 0 0  Down, Depressed, Hopeless 0 0 0  PHQ - 2 Score 0 0 0  Altered sleeping - - -  Tired, decreased energy - - -  Change in appetite - - -  Feeling bad or failure about yourself  - - -  Trouble concentrating - - -  Moving slowly or fidgety/restless - - -  Suicidal thoughts - - -  PHQ-9 Score - - -    Social History   Tobacco Use   Smoking status: Former   Smokeless tobacco: Former  Substance Use Topics   Alcohol use: Yes    Alcohol/week: 0.0 standard drinks   Drug use: Not Currently    Types: Marijuana    Comment: quit 09/2020    Review of Systems Per HPI unless specifically indicated above     Objective:    BP (!) 154/105 (BP Location: Left Arm, Cuff Size: Normal)   Pulse 63   Ht 5\' 9"  (1.753 m)   Wt 187 lb 12.8 oz (85.2 kg)   SpO2 100%   BMI 27.73 kg/m   Wt Readings from Last 3 Encounters:  10/10/21 187 lb 12.8 oz (85.2 kg)  03/02/21 177 lb 9.6 oz  (80.6 kg)  11/13/20 165 lb (74.8 kg)    Physical Exam Vitals and nursing note reviewed.  Constitutional:      General: He is not in acute distress.    Appearance: Normal appearance. He is well-developed. He is not diaphoretic.     Comments: Well-appearing, comfortable, cooperative  HENT:     Head: Normocephalic and atraumatic.  Eyes:     General:        Right eye: No discharge.        Left eye: No discharge.     Conjunctiva/sclera: Conjunctivae normal.  Cardiovascular:     Rate and Rhythm: Normal rate.  Pulmonary:     Effort: Pulmonary effort is normal.  Skin:    General: Skin is warm and dry.     Findings: No erythema or rash.  Neurological:     Mental Status: He is alert and oriented to person, place, and time.  Psychiatric:        Mood and Affect: Mood normal.        Behavior: Behavior normal.        Thought Content: Thought content normal.     Comments: Well groomed, good eye contact, normal speech and thoughts  Results for orders placed or performed in visit on 03/02/21  Hemoglobin A1c  Result Value Ref Range   Hgb A1c MFr Bld 5.3 <5.7 % of total Hgb   Mean Plasma Glucose 105 mg/dL   eAG (mmol/L) 5.8 mmol/L  CBC with Differential/Platelet  Result Value Ref Range   WBC 3.7 (L) 3.8 - 10.8 Thousand/uL   RBC 6.17 (H) 4.20 - 5.80 Million/uL   Hemoglobin 15.0 13.2 - 17.1 g/dL   HCT 88.4 16.6 - 06.3 %   MCV 78.0 (L) 80.0 - 100.0 fL   MCH 24.3 (L) 27.0 - 33.0 pg   MCHC 31.2 (L) 32.0 - 36.0 g/dL   RDW 01.6 01.0 - 93.2 %   Platelets 226 140 - 400 Thousand/uL   MPV 11.9 7.5 - 12.5 fL   Neutro Abs 1,865 1,500 - 7,800 cells/uL   Lymphs Abs 1,436 850 - 3,900 cells/uL   Absolute Monocytes 322 200 - 950 cells/uL   Eosinophils Absolute 59 15 - 500 cells/uL   Basophils Absolute 19 0 - 200 cells/uL   Neutrophils Relative % 50.4 %   Total Lymphocyte 38.8 %   Monocytes Relative 8.7 %   Eosinophils Relative 1.6 %   Basophils Relative 0.5 %  COMPLETE METABOLIC PANEL WITH  GFR  Result Value Ref Range   Glucose, Bld 80 65 - 99 mg/dL   BUN 12 7 - 25 mg/dL   Creat 3.55 7.32 - 2.02 mg/dL   GFR, Est Non African American 81 > OR = 60 mL/min/1.75m2   GFR, Est African American 93 > OR = 60 mL/min/1.33m2   BUN/Creatinine Ratio NOT APPLICABLE 6 - 22 (calc)   Sodium 139 135 - 146 mmol/L   Potassium 4.3 3.5 - 5.3 mmol/L   Chloride 102 98 - 110 mmol/L   CO2 26 20 - 32 mmol/L   Calcium 9.4 8.6 - 10.3 mg/dL   Total Protein 7.7 6.1 - 8.1 g/dL   Albumin 4.8 3.6 - 5.1 g/dL   Globulin 2.9 1.9 - 3.7 g/dL (calc)   AG Ratio 1.7 1.0 - 2.5 (calc)   Total Bilirubin 1.2 0.2 - 1.2 mg/dL   Alkaline phosphatase (APISO) 30 (L) 36 - 130 U/L   AST 45 (H) 10 - 40 U/L   ALT 48 (H) 9 - 46 U/L  Lipid panel  Result Value Ref Range   Cholesterol 205 (H) <200 mg/dL   HDL 91 > OR = 40 mg/dL   Triglycerides 44 <542 mg/dL   LDL Cholesterol (Calc) 101 (H) mg/dL (calc)   Total CHOL/HDL Ratio 2.3 <5.0 (calc)   Non-HDL Cholesterol (Calc) 114 <130 mg/dL (calc)  Hepatitis C antibody  Result Value Ref Range   Hepatitis C Ab NON-REACTIVE NON-REACTI   SIGNAL TO CUT-OFF 0.02 <1.00  TSH  Result Value Ref Range   TSH 1.90 0.40 - 4.50 mIU/L      Assessment & Plan:   Problem List Items Addressed This Visit     Essential hypertension - Primary    Elevated BP again today, likely with alcohol intake recently Less adherent to lifestyle but goal to resume Not on medication, has had avg controlled readings Outside BP at work reviewed - elevated readings No known complications - history of elevated Creatinine - ACEi intolerance cough / prior on amlodipine - Concern with history of alcohol consumption, now significantly reduced  Plan:  1. Start ARB Olmesartan 20mg  daily - new therapy discuss risk benefits and repeat chemistry lab in 4 weeks.  2. Encourage improved lifestyle - low sodium diet, regular exercise 3. Continue monitor BP outside office, bring readings to next visit, if persistently  >140/90 or new symptoms notify office sooner  Reconsider add on Amlodipine vs Thaizide vs other class if indicated      Relevant Medications   olmesartan (BENICAR) 20 MG tablet   Other Relevant Orders   BASIC METABOLIC PANEL WITH GFR   Alcohol dependence (HCC)   Other Visit Diagnoses     Needs flu shot       Relevant Orders   Flu Vaccine QUAD 18mo+IM (Fluarix, Fluzone & Alfiuria Quad PF) (Completed)       Alcohol dependence Counsel on limit and reduce alcohol intake gradually in future.  Meds ordered this encounter  Medications   olmesartan (BENICAR) 20 MG tablet    Sig: Take 1 tablet (20 mg total) by mouth daily.    Dispense:  30 tablet    Refill:  2      Follow up plan: Return in about 4 weeks (around 11/07/2021) for 4 week non fasting lab only BMET then 1 week later follow-up HTN, med.  Future labs ordered for BMET 4 weeks   Saralyn Pilar, DO [facility] 10/10/2021, 8:12 AM

## 2021-10-10 NOTE — Assessment & Plan Note (Signed)
Elevated BP again today, likely with alcohol intake recently Less adherent to lifestyle but goal to resume Not on medication, has had avg controlled readings Outside BP at work reviewed - elevated readings No known complications - history of elevated Creatinine - ACEi intolerance cough / prior on amlodipine - Concern with history of alcohol consumption, now significantly reduced  Plan:  1. Start ARB Olmesartan 20mg  daily - new therapy discuss risk benefits and repeat chemistry lab in 4 weeks. 2. Encourage improved lifestyle - low sodium diet, regular exercise 3. Continue monitor BP outside office, bring readings to next visit, if persistently >140/90 or new symptoms notify office sooner  Reconsider add on Amlodipine vs Thaizide vs other class if indicated

## 2021-11-11 ENCOUNTER — Other Ambulatory Visit: Payer: Self-pay

## 2021-11-11 DIAGNOSIS — I10 Essential (primary) hypertension: Secondary | ICD-10-CM

## 2021-11-15 ENCOUNTER — Ambulatory Visit: Payer: Commercial Managed Care - PPO | Admitting: Family Medicine

## 2021-11-15 ENCOUNTER — Other Ambulatory Visit: Payer: Commercial Managed Care - PPO

## 2021-11-16 ENCOUNTER — Other Ambulatory Visit: Payer: Commercial Managed Care - PPO

## 2021-11-16 ENCOUNTER — Other Ambulatory Visit: Payer: Self-pay

## 2021-11-17 LAB — BASIC METABOLIC PANEL WITH GFR
BUN/Creatinine Ratio: 9 (calc) (ref 6–22)
BUN: 12 mg/dL (ref 7–25)
CO2: 26 mmol/L (ref 20–32)
Calcium: 9.4 mg/dL (ref 8.6–10.3)
Chloride: 108 mmol/L (ref 98–110)
Creat: 1.3 mg/dL — ABNORMAL HIGH (ref 0.60–1.29)
Glucose, Bld: 90 mg/dL (ref 65–99)
Potassium: 4.3 mmol/L (ref 3.5–5.3)
Sodium: 141 mmol/L (ref 135–146)
eGFR: 71 mL/min/{1.73_m2} (ref >=60)

## 2021-11-18 ENCOUNTER — Ambulatory Visit (INDEPENDENT_AMBULATORY_CARE_PROVIDER_SITE_OTHER): Payer: Commercial Managed Care - PPO | Admitting: Family Medicine

## 2021-11-18 ENCOUNTER — Encounter: Payer: Self-pay | Admitting: Family Medicine

## 2021-11-18 ENCOUNTER — Other Ambulatory Visit: Payer: Self-pay

## 2021-11-18 VITALS — BP 148/108 | HR 58 | Temp 98.1°F | Ht 69.0 in | Wt 191.0 lb

## 2021-11-18 DIAGNOSIS — I10 Essential (primary) hypertension: Secondary | ICD-10-CM

## 2021-11-18 MED ORDER — OLMESARTAN MEDOXOMIL 40 MG PO TABS: 40.0000 mg | ORAL_TABLET | Freq: Every day | ORAL | 1 refills | Status: DC

## 2021-11-18 NOTE — Patient Instructions (Addendum)
Thank you for coming to the office today.  Increase dose to Olmesartan 40mg  daily, take TWO of the 20s or pick up the new 40 mg dosage.  Keep track of BP, goal is to get BP < 140 / 90, really the bottom number goal < 90  Dr for advanced hypertension clinic to help Chilton Si figure out what to do next if still elevated.  [address] [place]   Please schedule a Follow-up Appointment to: Return in about 3 months (around 03/02/2022) for Request 03/02/22 Annual Physical AM apt with fasting lab AFTER.  If you have any other questions or concerns, please feel free to call the office or send a message through MyChart. You may also schedule an earlier appointment if necessary.  Additionally, you may be receiving a survey about your experience at our office within a few days to 1 week by e-mail or mail. We value your feedback.  03/04/22, DO [facility], [facility]

## 2021-11-18 NOTE — Assessment & Plan Note (Addendum)
Elevated BP again today despite new med ARB added 1 month ago. Lab mild inc on Cr to be expected on new ARB Less adherent to lifestyle but goal to resume Missed some med doses Some alcohol consumption but reported less amount now Outside BP at work reviewed - elevated readings No known complications - history of elevated Creatinine - ACEi intolerance cough / prior on amlodipine - Concern with history of alcohol consumption, now significantly reduced  Plan:  1. DOSE INCREASE Olmesartan from 20 to 40mg  daily new rx sent. 2. Encourage improved lifestyle - low sodium diet, regular exercise 3. Continue monitor BP outside office, bring readings to next visit, if persistently >140/90 or new symptoms notify office sooner  Reconsider CCB vs Thiazide as next options. However given lack of any improvement on BP will request a consultation with Adv HTN clinic for further assistance. With various treatment trials in past 2+ years patient has not had successfully controlled BP, given young age but likely family history and other factors such as adherence / diet/sodium / alcohol also contributing - ultimately we decided together than consult would be good next step instead of trying more meds. I am hesitant to keep adding more meds today.

## 2021-11-18 NOTE — Progress Notes (Signed)
Subjective:    Patient ID: Carl Rice, male    DOB: [birth year], 40 y.o.   MRN: [id number]  Carl Rice is a 40 y.o. male presenting on 11/18/2021 for Hypertension   HPI  CHRONIC HTN / Overweight BMI >28 / Alcohol Dependence. See prior notes for background Last visit started on ARB Olmesartan $RemoveBefore'20mg'yuKrdNCYVkkiM$  daily - he missed 4-5 days or more of this med recently due to illness Labs done recently showed slight inc creatinine on ARB as expected Current Meds - Olmesartan $RemoveBefo'20mg'jegLRPWiRYK$  daily, took med today - Past medication Lisinopril - caused cough side effect - Off Amlodipine past med, with side effect Lifestyle: Still goal to work on low sodium and diet, has not adhered yet - Exercise: resume gym work outs Fam history of HTN. Admits drinking alcohol usually 1-2 x per week, lately with vacation was drinking large amounts He can go several days without drinking without complications of withdrawal Denies CP, dyspnea, HA, edema, dizziness / lightheadedness   Depression screen [facility] 2/9 11/18/2021 10/10/2021 05/31/2020  Decreased Interest 2 0 0  Down, Depressed, Hopeless 1 0 0  PHQ - 2 Score 3 0 0  Altered sleeping 0 - -  Tired, decreased energy 0 - -  Change in appetite 0 - -  Feeling bad or failure about yourself  1 - -  Trouble concentrating 0 - -  Moving slowly or fidgety/restless 0 - -  Suicidal thoughts 0 - -  PHQ-9 Score 4 - -  Difficult doing work/chores Not difficult at all - -    Social History   Tobacco Use   Smoking status: Former   Smokeless tobacco: Former  Substance Use Topics   Alcohol use: Yes    Alcohol/week: 0.0 standard drinks   Drug use: Not Currently    Types: Marijuana    Comment: quit 09/2020    Review of Systems Per HPI unless specifically indicated above     Objective:    BP (!) 148/108 (BP Location: Left Arm, Cuff Size: Normal)    Pulse (!) 58    Temp 98.1 F (36.7 C) (Oral)    Ht $R'5\' 9"'HY$  (1.753 m)    Wt 191 lb (86.6 kg)    SpO2 100%    BMI 28.21 kg/m    Wt Readings from Last 3 Encounters:  11/18/21 191 lb (86.6 kg)  10/10/21 187 lb 12.8 oz (85.2 kg)  03/02/21 177 lb 9.6 oz (80.6 kg)    Physical Exam Vitals and nursing note reviewed.  Constitutional:      General: He is not in acute distress.    Appearance: Normal appearance. He is well-developed. He is not diaphoretic.     Comments: Well-appearing, comfortable, cooperative  HENT:     Head: Normocephalic and atraumatic.  Eyes:     General:        Right eye: No discharge.        Left eye: No discharge.     Conjunctiva/sclera: Conjunctivae normal.  Cardiovascular:     Rate and Rhythm: Normal rate.  Pulmonary:     Effort: Pulmonary effort is normal.  Skin:    General: Skin is warm and dry.     Findings: No erythema or rash.  Neurological:     Mental Status: He is alert and oriented to person, place, and time.  Psychiatric:        Mood and Affect: Mood normal.        Behavior: Behavior normal.  Thought Content: Thought content normal.     Comments: Well groomed, good eye contact, normal speech and thoughts     Results for orders placed or performed in visit on 76/28/31  BASIC METABOLIC PANEL WITH GFR  Result Value Ref Range   Glucose, Bld 90 65 - 99 mg/dL   BUN 12 7 - 25 mg/dL   Creat 1.30 (H) 0.60 - 1.29 mg/dL   eGFR 71 > OR = 60 mL/min/1.33m2   BUN/Creatinine Ratio 9 6 - 22 (calc)   Sodium 141 135 - 146 mmol/L   Potassium 4.3 3.5 - 5.3 mmol/L   Chloride 108 98 - 110 mmol/L   CO2 26 20 - 32 mmol/L   Calcium 9.4 8.6 - 10.3 mg/dL      Assessment & Plan:   Problem List Items Addressed This Visit     Essential hypertension - Primary    Elevated BP again today despite new med ARB added 1 month ago. Lab mild inc on Cr to be expected on new ARB Less adherent to lifestyle but goal to resume Missed some med doses Some alcohol consumption but reported less amount now Outside BP at work reviewed - elevated readings No known complications - history of elevated  Creatinine - ACEi intolerance cough / prior on amlodipine - Concern with history of alcohol consumption, now significantly reduced  Plan:  1. DOSE INCREASE Olmesartan from 20 to $Rem'40mg'ubgT$  daily new rx sent. 2. Encourage improved lifestyle - low sodium diet, regular exercise 3. Continue monitor BP outside office, bring readings to next visit, if persistently >140/90 or new symptoms notify office sooner  Reconsider CCB vs Thiazide as next options. However given lack of any improvement on BP will request a consultation with Adv HTN clinic for further assistance. With various treatment trials in past 2+ years patient has not had successfully controlled BP, given young age but likely family history and other factors such as adherence / diet/sodium / alcohol also contributing - ultimately we decided together than consult would be good next step instead of trying more meds. I am hesitant to keep adding more meds today.      Relevant Medications   olmesartan (BENICAR) 40 MG tablet   Other Relevant Orders   Ambulatory referral to [facility] [place] ordered this encounter  Medications   olmesartan (BENICAR) 40 MG tablet    Sig: Take 1 tablet (40 mg total) by mouth daily.    Dispense:  90 tablet    Refill:  1    Dose increase from 20 to $Rem'40mg'IrtE$       Orders Placed This Encounter  Procedures   Ambulatory referral to [facility] [place]    Referral Priority:   Routine    Referral Type:   Consultation    Referral Reason:   Specialty Services Required    Referred to Provider:   Skeet Latch, MD    Requested Specialty:   Cardiology    Number of Visits Requested:   1     Follow up plan: Return in about 3 months (around 03/02/2022) for Request 03/02/22 Annual Physical AM apt with fasting lab AFTER.   Nobie Putnam, [facility] 11/18/2021, 9:16 AM

## 2022-01-05 ENCOUNTER — Other Ambulatory Visit: Payer: Self-pay | Admitting: Family Medicine

## 2022-01-05 DIAGNOSIS — I10 Essential (primary) hypertension: Secondary | ICD-10-CM

## 2022-01-05 NOTE — Telephone Encounter (Signed)
Requested medications are due for refill today.  no  Requested medications are on the active medications list.  no  Last refill. 10/10/2021  Future visit scheduled.   yes  Notes to clinic.  This dosage was discontinued 11/18/2021. Pt now takes 40 mg.    Requested Prescriptions  Pending Prescriptions Disp Refills   olmesartan (BENICAR) 20 MG tablet [Pharmacy Med Name: OLMESARTAN MEDOXOMIL 20 MG TAB] 90 tablet     Sig: TAKE 1 TABLET BY MOUTH EVERY DAY     Cardiovascular:  Angiotensin Receptor Blockers Failed - 01/05/2022  1:46 AM      Failed - Cr in normal range and within 180 days    Creat  Date Value Ref Range Status  11/16/2021 1.30 (H) 0.60 - 1.29 mg/dL Final          Failed - Last BP in normal range    BP Readings from Last 1 Encounters:  11/18/21 (!) 148/108          Passed - K in normal range and within 180 days    Potassium  Date Value Ref Range Status  11/16/2021 4.3 3.5 - 5.3 mmol/L Final  09/30/2012 4.2 3.5 - 5.1 mmol/L Final          Passed - Patient is not pregnant      Passed - Valid encounter within last 6 months    Recent Outpatient Visits           1 month ago Essential hypertension   [facility] Smitty Cords, DO   2 months ago Essential hypertension   [facility] Smitty Cords, DO   10 months ago Annual physical exam   [facility] Smitty Cords, DO   1 year ago LAD (lymphadenopathy), anterior cervical   [facility] Smitty Cords, DO   1 year ago Essential hypertension   [facility] [place], Netta Neat, DO       Future Appointments             In 3 months Althea Charon, Netta Neat, DO [facility], [facility]

## 2022-01-20 NOTE — Progress Notes (Signed)
Carl Rice left message 1/10, 1/26, and 1/31 to call back

## 2022-04-03 ENCOUNTER — Telehealth: Payer: Self-pay | Admitting: Pharmacist

## 2022-04-03 NOTE — Telephone Encounter (Signed)
?  Chronic Care Management  ? ?Outreach Note ? ?04/03/2022 ?Name: Carl Rice MRN: [id number] DOB: 1981-09-30 ? ? ?Patient appearing on report for [place] [facility] Hypertension Control due to last documented ambulatory blood pressure of 148/108 on 11/18/2021. Next appointment with PCP is 04/14/2022  ? ?Outreached patient to discuss hypertension control and medication management.  ? ?Follow Up Plan: CM Pharmacist will outreach to patient by telephone again within the next 30 days ? ?Estelle Grumbles, PharmD, BCACP ?Clinical Pharmacist ?[facility] ?[place] ?[phone] ? ?

## 2022-04-14 ENCOUNTER — Encounter: Payer: Commercial Managed Care - PPO | Admitting: Family Medicine

## 2022-05-17 ENCOUNTER — Ambulatory Visit: Payer: Commercial Managed Care - PPO | Admitting: Pharmacist

## 2022-05-17 DIAGNOSIS — I10 Essential (primary) hypertension: Secondary | ICD-10-CM

## 2022-05-17 NOTE — Patient Instructions (Signed)
Check your blood pressure once-twice weekly, and any time you have concerning symptoms like headache, chest pain, dizziness, shortness of breath, or vision changes.   Our goal is less than 140/90.  To appropriately check your blood pressure, make sure you do the following:  1) Avoid caffeine, exercise, or tobacco products for 30 minutes before checking. Empty your bladder. 2) Sit with your back supported in a flat-backed chair. Rest your arm on something flat (arm of the chair, table, etc). 3) Sit still with your feet flat on the floor, resting, for at least 5 minutes.  4) Check your blood pressure. Take 1-2 readings.  5) Write down these readings and bring with you to any provider appointments.  Bring your home blood pressure machine with you to a provider's office for accuracy comparison at least once a year.   Make sure you take your blood pressure medications before you come to any office visit, even if you were asked to fast for labs.  Trella Thurmond Sonny Anthes, PharmD, BCACP Clinical Pharmacist [facility] [place] [phone]  

## 2022-05-17 NOTE — Chronic Care Management (AMB) (Signed)
  Chronic Care Management   Outreach Note  05/17/2022 Name: Carl Rice MRN: [id number] DOB: [birth year]  I connected with Carl Rice on 05/17/22 by telephone outreach and verified that I am speaking with the correct person using two identifiers.  Patient appearing on report for [place] [facility]ric Hypertension Control due to last documented ambulatory blood pressure of 148/108 on 11/18/2021. Next appointment with PCP is 04/14/2022   Outreached patient to discuss hypertension control and medication management.   Outpatient Encounter Medications as of 05/17/2022  Medication Sig   olmesartan (BENICAR) 40 MG tablet Take 1 tablet (40 mg total) by mouth daily.   valACYclovir (VALTREX) 500 MG tablet Take 1 tablet (500 mg total) by mouth 2 (two) times daily. Take for 5 days when needed.   No facility-administered encounter medications on file as of 05/17/2022.    Lab Results  Component Value Date   CREATININE 1.30 (H) 11/16/2021   BUN 12 11/16/2021   NA 141 11/16/2021   K 4.3 11/16/2021   CL 108 11/16/2021   CO2 26 11/16/2021    BP Readings from Last 3 Encounters:  11/18/21 (!) 148/108  10/10/21 (!) 154/105  03/02/21 (!) 144/90    Pulse Readings from Last 3 Encounters:  11/18/21 (!) 58  10/10/21 63  03/02/21 61    Current medications: olmesartan 40 mg daily   Home Monitoring: Patient does not have an automated upper arm home BP machine Discuss the benefits of having a home blood pressure monitor. Patient states he might be able to have his blood pressure monitored by nurse at work and will look into this Counsel on BP monitoring technique  Current physical activity: Reports currently limited  Denies adding salt to his food.  Counsel on impact of salt/sodium on blood pressure Encourage patient to review nutrition labels for sodium content of foods   Assessment/Plan: - Currently uncontrolled - Counseled on long term microvascular and macrovascular complications of  uncontrolled hypertension - Reviewed appropriate home BP monitoring technique (avoid caffeine, smoking, and exercise for 30 minutes before checking, rest for at least 5 minutes before taking BP, sit with feet flat on the floor and back against a hard surface, uncross legs, and rest arm on flat surface) - Reviewed to see if able to have his blood pressure monitored at work- to check blood pressure once to twice weekly, document, and provide at next provider visit - Discussed dietary modifications, such as reduced salt intake, focus on whole grains, vegetables, lean proteins - Discussed goal of increasing moderate intensity physical activity weekly   Follow Up Plan: Patient to keep upcoming appointment with PCP on 07/14/2022, but to contact the office sooner if needed for blood pressure readings outside of established parameters or other medical questions/concerns  Estelle Grumbles, PharmD, [facility] Clinical Pharmacist [facility] [phone]

## 2022-06-17 ENCOUNTER — Emergency Department
Admission: EM | Admit: 2022-06-17 | Discharge: 2022-06-17 | Disposition: A | Discharge status: Home / Self Care | Payer: Commercial Managed Care - PPO | Attending: Emergency Medicine | Admitting: Emergency Medicine

## 2022-06-17 ENCOUNTER — Other Ambulatory Visit: Payer: Self-pay

## 2022-06-17 ENCOUNTER — Encounter: Payer: Self-pay | Admitting: Emergency Medicine

## 2022-06-17 DIAGNOSIS — B009 Herpesviral infection, unspecified: Secondary | ICD-10-CM | POA: Diagnosis present

## 2022-06-17 DIAGNOSIS — I10 Essential (primary) hypertension: Secondary | ICD-10-CM | POA: Insufficient documentation

## 2022-06-17 MED ORDER — VALACYCLOVIR HCL 1 G PO TABS: 500.0000 mg | ORAL_TABLET | Freq: Two times a day (BID) | ORAL | 0 refills | Status: AC

## 2022-06-17 NOTE — Discharge Instructions (Addendum)
You may take the medication as prescribed.  Please return for any new, worsening, or change in symptoms or other concerns.  You did not wish to have any formal testing today. Please remember that any partners also need to be tested and treated.

## 2022-06-17 NOTE — ED Notes (Signed)
Patient refused discharge vital signs. 

## 2022-06-17 NOTE — ED Provider Notes (Signed)
[facility] Provider Note    Event Date/Time   First MD Initiated Contact with Patient 06/17/22 1447     (approximate)   History   SEXUALLY TRANSMITTED DISEASE   HPI  Carl Rice is a 41 y.o. male who presents today with request for Valtrex.  Patient reports that he has a history of HSV and gets flares every 6 to 12 months, and reports that he feels like he is getting a flare.  He has noticed a couple of lesions that are consistent with his prior HSV flares.  He denies any burning with urination or penile discharge.  He reports that he told the triage nurse "something different because he was embarrassed."  He denies being sexually active currently.  No abdominal pain, fevers, chills.  Patient Active Problem List   Diagnosis Date Noted   Overweight (BMI 25.0-29.9) 03/02/2021   Alcohol dependence (HCC) 12/04/2018   Gastroesophageal reflux disease 12/04/2018   Genital herpes 08/24/2015   Essential hypertension 08/24/2015          Physical Exam   Triage Vital Signs: ED Triage Vitals  Enc Vitals Group     BP 06/17/22 1437 (!) 155/103     Pulse Rate 06/17/22 1437 71     Resp 06/17/22 1437 20     Temp 06/17/22 1437 98 F (36.7 C)     Temp Source 06/17/22 1437 Oral     SpO2 06/17/22 1437 98 %     Weight 06/17/22 1436 180 lb (81.6 kg)     Height 06/17/22 1436 5\' 9"  (1.753 m)     Head Circumference --      Peak Flow --      Pain Score 06/17/22 1436 0     Pain Loc --      Pain Edu? --      Excl. in GC? --     Most recent vital signs: Vitals:   06/17/22 1437  BP: (!) 155/103  Pulse: 71  Resp: 20  Temp: 98 F (36.7 C)  SpO2: 98%    Physical Exam Vitals and nursing note reviewed.  Constitutional:      General: Awake and alert. No acute distress.    Appearance: Normal appearance. The patient is normal weight.  HENT:     Head: Normocephalic and atraumatic.     Mouth: Mucous membranes are moist.  Eyes:     General: PERRL. Normal EOMs         Right eye: No discharge.        Left eye: No discharge.     Conjunctiva/sclera: Conjunctivae normal.  Cardiovascular:     Rate and Rhythm: Normal rate and regular rhythm.     Pulses: Normal pulses.     Heart sounds: Normal heart sounds Pulmonary:     Effort: Pulmonary effort is normal. No respiratory distress.     Breath sounds: Normal breath sounds.  Abdominal:     Abdomen is soft. There is no abdominal tenderness. No rebound or guarding. No distention. GU: Two small ulcerated lesions to scrotum. No testicular tenderness or swelling. Mildly TTP. No drainage. No penile shaft lesions.  No visible discharge Musculoskeletal:        General: No swelling. Normal range of motion.     Cervical back: Normal range of motion and neck supple.  Skin:    General: Skin is warm and dry.     Capillary Refill: Capillary refill takes less than 2 seconds.  Findings: No rash.  Neurological:     Mental Status: The patient is awake and alert.      ED Results / Procedures / Treatments   Labs (all labs ordered are listed, but only abnormal results are displayed) Labs Reviewed  CHLAMYDIA/NGC RT PCR ([facility] ONLY)            URINALYSIS, ROUTINE W REFLEX MICROSCOPIC     EKG     RADIOLOGY     PROCEDURES:  Critical Care performed:   Procedures   MEDICATIONS ORDERED IN ED: Medications - No data to display   IMPRESSION / MDM / ASSESSMENT AND PLAN / ED COURSE  I reviewed the triage vital signs and the nursing notes.   Differential diagnosis includes, but is not limited to, HSV, other STD, syphilis.  Patient is awake and alert, hemodynamically stable and afebrile.  He reports that he has had numerous flares, and this feels consistent with the previous flares.  He has run out of refills of his Valtrex which is what he is requesting today.  He does not wish to be tested for other sexually transmitted diseases, does not wish to provide a urine sample.  Denies any recent sexual activity.   He reports that he has had multiple flares of his HSV and he is certain that this is what is going on today.  He does not want any further work-up.  His Valtrex was refilled for him.  We discussed return precautions and the importance of close outpatient follow-up.  Also advised that he abstain from sexual activity  until fully treated/treated. He understands and agrees. Discharged in stable condition.   Patient's presentation is most consistent with exacerbation of chronic illness.    FINAL CLINICAL IMPRESSION(S) / ED DIAGNOSES   Final diagnoses:  HSV (herpes simplex virus) infection     Rx / DC Orders   ED Discharge Orders          Ordered    valACYclovir (VALTREX) 1000 MG tablet  2 times daily        06/17/22 1521             Note:  This document was prepared using Dragon voice recognition software and may include unintentional dictation errors.   Keturah Shavers 06/17/22 1754    Concha Se, MD 06/18/22 1515

## 2022-06-17 NOTE — ED Notes (Signed)
The pt advised he was having a hsv flare up. The pt c/o irritation.

## 2022-06-17 NOTE — ED Notes (Signed)
Computer is not working in the room.   

## 2022-06-17 NOTE — ED Triage Notes (Signed)
Pt via POV from home. Pt c/o burning when he pees. Pt states it started today. Pt is A&OX4 and NAD

## 2022-07-14 ENCOUNTER — Encounter: Payer: Commercial Managed Care - PPO | Admitting: Family Medicine

## 2022-07-19 ENCOUNTER — Encounter: Payer: Self-pay | Admitting: Family Medicine

## 2022-07-19 ENCOUNTER — Ambulatory Visit (INDEPENDENT_AMBULATORY_CARE_PROVIDER_SITE_OTHER): Payer: Commercial Managed Care - PPO | Admitting: Family Medicine

## 2022-07-19 VITALS — BP 150/110 | HR 60 | Ht 69.0 in | Wt 182.2 lb

## 2022-07-19 DIAGNOSIS — A6002 Herpesviral infection of other male genital organs: Secondary | ICD-10-CM

## 2022-07-19 DIAGNOSIS — E663 Overweight: Secondary | ICD-10-CM

## 2022-07-19 DIAGNOSIS — R7309 Other abnormal glucose: Secondary | ICD-10-CM

## 2022-07-19 DIAGNOSIS — I10 Essential (primary) hypertension: Secondary | ICD-10-CM | POA: Diagnosis not present

## 2022-07-19 DIAGNOSIS — F1021 Alcohol dependence, in remission: Secondary | ICD-10-CM

## 2022-07-19 DIAGNOSIS — E7800 Pure hypercholesterolemia, unspecified: Secondary | ICD-10-CM

## 2022-07-19 DIAGNOSIS — Z0000 Encounter for general adult medical examination without abnormal findings: Secondary | ICD-10-CM

## 2022-07-19 MED ORDER — OLMESARTAN MEDOXOMIL 40 MG PO TABS: 40.0000 mg | ORAL_TABLET | Freq: Every day | ORAL | 3 refills | Status: DC

## 2022-07-19 MED ORDER — VALACYCLOVIR HCL 500 MG PO TABS: 500.0000 mg | ORAL_TABLET | Freq: Two times a day (BID) | ORAL | 5 refills | Status: DC

## 2022-07-19 MED ORDER — HYDROCHLOROTHIAZIDE 25 MG PO TABS: 25.0000 mg | ORAL_TABLET | Freq: Every day | ORAL | 3 refills | Status: DC

## 2022-07-19 NOTE — Patient Instructions (Addendum)
Thank you for coming to the office today.  Consider Naltrexone oral medication or Vivitrol injection monthly if interested. These reduce cravings and help you cope with quitting alcohol and remaining off, it reduces the pleasure gained from alcohol as well.  Omron BP Cuff - can get online and monitor BP  Start new BP med Hydrochlorothiazide 25mg  daily In addition to current BP med Olmesartan 40mg  daily  Refilled Valtrex   DUE for FASTING BLOOD WORK (no food or drink after midnight before the lab appointment, only water or coffee without cream/sugar on the morning of)  SCHEDULE "Lab Only" visit in the morning at the clinic for lab draw in 1 day  REPEAT lab in 4 weeks, can eat and drink  - Make sure Lab Only appointment is at about 1 week before your next appointment, so that results will be available  For Lab Results, once available within 2-3 days of blood draw, you can can log in to MyChart online to view your results and a brief explanation. Also, we can discuss results at next follow-up visit.   Please schedule a Follow-up Appointment to: Return in about 1 day (around 07/20/2022) for 1 day for fasting lab only Thurs 8/31 830AM - then again Lab Only 4 weeks BMET and 10/2022 F/u HTN.  If you have any other questions or concerns, please feel free to call the office or send a message through MyChart. You may also schedule an earlier appointment if necessary.  Additionally, you may be receiving a survey about your experience at our office within a few days to 1 week by e-mail or mail. We value your feedback.  9/31, DO [facility], Saralyn Pilar

## 2022-07-19 NOTE — Progress Notes (Unsigned)
Subjective:    Patient ID: Carl Rice, male    DOB: 06-01-1981, 41 y.o.   MRN: [id number]  Carl Rice is a 41 y.o. male presenting on 07/19/2022 for Annual Exam   HPI  Here for Annual Physical and Lab Review.  CHRONIC HTN / Overweight BMI >26 / Alcohol Dependence. See prior notes for background Last visit started on ARB Olmesartan $RemoveBefore'20mg'NOODTGSrfkrCM$  daily - he missed 4-5 days or more of this med recently due to illness Labs done recently showed slight inc creatinine on ARB as expected Current Meds - Olmesartan $RemoveBefo'20mg'fQoGjOKRjWa$  daily, took med today - Past medication Lisinopril - caused cough side effect - Off Amlodipine past med, with side effect Lifestyle: Still goal to work on low sodium and diet, has not adhered yet - Exercise: resume gym work outs Fam history of HTN. Admits drinking alcohol usually 1-2 x per week, lately with vacation was drinking large amounts He can go several days without drinking without complications of withdrawal Denies CP, dyspnea, HA, edema, dizziness / lightheadedness   Health Maintenance: ***     11/18/2021    9:12 AM 10/10/2021    9:11 AM 05/31/2020    8:13 AM  Depression screen PHQ 2/9  Decreased Interest 2 0 0  Down, Depressed, Hopeless 1 0 0  PHQ - 2 Score 3 0 0  Altered sleeping 0    Tired, decreased energy 0    Change in appetite 0    Feeling bad or failure about yourself  1    Trouble concentrating 0    Moving slowly or fidgety/restless 0    Suicidal thoughts 0    PHQ-9 Score 4    Difficult doing work/chores Not difficult at all      Past Medical History:  Diagnosis Date   Genital herpes    History reviewed. No pertinent surgical history. Social History   Socioeconomic History   Marital status: Single    Spouse name: Not on file   Number of children: Not on file   Years of education: Not on file   Highest education level: Not on file  Occupational History   Not on file  Tobacco Use   Smoking status: Former   Smokeless tobacco: Former   Substance and Sexual Activity   Alcohol use: Yes    Alcohol/week: 0.0 standard drinks of alcohol   Drug use: Not Currently    Types: Marijuana    Comment: quit 09/2020   Sexual activity: Not on file  Other Topics Concern   Not on file  Social History Narrative   Not on file   Social Determinants of Health   Financial Resource Strain: Not on file  Food Insecurity: Not on file  Transportation Needs: Not on file  Physical Activity: Not on file  Stress: Not on file  Social Connections: Not on file  Intimate Partner Violence: Not on file   Family History  Problem Relation Age of Onset   Hypertension Mother    Drug abuse Mother    Drug abuse Father    Hypertension Father    Kidney disease Son    No current outpatient medications on file prior to visit.   No current facility-administered medications on file prior to visit.    Review of Systems Per HPI unless specifically indicated above     Objective:    BP (!) 150/110 (BP Location: Left Arm, Cuff Size: Normal)   Pulse 60   Ht $R'5\' 9"'Ec$  (1.753 m)   Wt  182 lb 3.2 oz (82.6 kg)   SpO2 100%   BMI 26.91 kg/m   Wt Readings from Last 3 Encounters:  07/19/22 182 lb 3.2 oz (82.6 kg)  06/17/22 180 lb (81.6 kg)  11/18/21 191 lb (86.6 kg)    Physical Exam  Results for orders placed or performed in visit on 76/81/15  BASIC METABOLIC PANEL WITH GFR  Result Value Ref Range   Glucose, Bld 90 65 - 99 mg/dL   BUN 12 7 - 25 mg/dL   Creat 1.30 (H) 0.60 - 1.29 mg/dL   eGFR 71 > OR = 60 mL/min/1.49m2   BUN/Creatinine Ratio 9 6 - 22 (calc)   Sodium 141 135 - 146 mmol/L   Potassium 4.3 3.5 - 5.3 mmol/L   Chloride 108 98 - 110 mmol/L   CO2 26 20 - 32 mmol/L   Calcium 9.4 8.6 - 10.3 mg/dL      Assessment & Plan:   Problem List Items Addressed This Visit     Essential hypertension   Relevant Medications   hydrochlorothiazide (HYDRODIURIL) 25 MG tablet   olmesartan (BENICAR) 40 MG tablet   Other Relevant Orders   COMPLETE  METABOLIC PANEL WITH GFR   CBC with Differential/Platelet   Genital herpes   Relevant Medications   valACYclovir (VALTREX) 500 MG tablet   Overweight (BMI 25.0-29.9)   Relevant Orders   Lipid panel   Other Visit Diagnoses     Annual physical exam    -  Primary   Relevant Orders   COMPLETE METABOLIC PANEL WITH GFR   CBC with Differential/Platelet   Lipid panel   Hemoglobin A1c   TSH   Abnormal glucose       Relevant Orders   Hemoglobin A1c   Elevated LDL cholesterol level       Relevant Orders   Lipid panel   TSH       Updated Health Maintenance information Fasting lab tomorrow, then repeat BMET in 4 weeks Encouraged improvement to lifestyle with diet and exercise Goal of weight loss   Meds ordered this encounter  Medications   hydrochlorothiazide (HYDRODIURIL) 25 MG tablet    Sig: Take 1 tablet (25 mg total) by mouth daily.    Dispense:  90 tablet    Refill:  3   olmesartan (BENICAR) 40 MG tablet    Sig: Take 1 tablet (40 mg total) by mouth daily.    Dispense:  90 tablet    Refill:  3   valACYclovir (VALTREX) 500 MG tablet    Sig: Take 1 tablet (500 mg total) by mouth 2 (two) times daily. Take for 5 days when needed.    Dispense:  20 tablet    Refill:  5      Follow up plan: Return in about 1 day (around 07/20/2022) for 1 day for fasting lab only Thurs 8/31 830AM - then again Lab Only 4 weeks BMET and 10/2022 F/u HTN.  Labs tomorrow 8/31, and then BMET 4 weeks  Nobie Putnam, [facility] 07/19/2022, 4:15 PM

## 2022-07-20 ENCOUNTER — Other Ambulatory Visit: Payer: Self-pay

## 2022-07-20 ENCOUNTER — Other Ambulatory Visit: Payer: Commercial Managed Care - PPO

## 2022-07-20 DIAGNOSIS — E663 Overweight: Secondary | ICD-10-CM

## 2022-07-20 DIAGNOSIS — E7800 Pure hypercholesterolemia, unspecified: Secondary | ICD-10-CM

## 2022-07-20 DIAGNOSIS — Z0000 Encounter for general adult medical examination without abnormal findings: Secondary | ICD-10-CM

## 2022-07-20 DIAGNOSIS — R7309 Other abnormal glucose: Secondary | ICD-10-CM

## 2022-07-20 DIAGNOSIS — I10 Essential (primary) hypertension: Secondary | ICD-10-CM

## 2022-07-20 NOTE — Assessment & Plan Note (Signed)
Elevated BP again today, repeat Lab mild inc on Cr to be expected on ARB Less adherent to lifestyle but goal to resume Missed some med doses Alcohol consumption again, now recently quit for past few days. No outside readings No known complications - history of elevated Creatinine - ACEi intolerance cough / prior on amlodipine - Concern with history of alcohol consumption, now significantly reduced  Plan:  Obtain [employer] BP Cuff - can get online and monitor BP  Start new BP med Hydrochlorothiazide 25mg  daily In addition to current BP med Olmesartan 40mg  daily  Encourage improved lifestyle - low sodium diet, regular exercise  Continue monitor BP outside office, bring readings to next visit, if persistently >140/90 or new symptoms notify office sooner  Note previously referred to [facility] [place] but he was unable to schedule, reconsider this again.  Alcohol cessation is important and reconsider sleep apnea if indicated

## 2022-07-21 LAB — CBC WITH DIFFERENTIAL/PLATELET
Absolute Monocytes: 323 cells/uL (ref 200–950)
Basophils Absolute: 8 cells/uL (ref 0–200)
Basophils Relative: 0.2 %
Eosinophils Absolute: 80 cells/uL (ref 15–500)
Eosinophils Relative: 1.9 %
HCT: 47.9 % (ref 38.5–50.0)
Hemoglobin: 15.1 g/dL (ref 13.2–17.1)
Lymphs Abs: 1777 cells/uL (ref 850–3900)
MCH: 24.9 pg — ABNORMAL LOW (ref 27.0–33.0)
MCHC: 31.5 g/dL — ABNORMAL LOW (ref 32.0–36.0)
MCV: 79 fL — ABNORMAL LOW (ref 80.0–100.0)
MPV: 11.4 fL (ref 7.5–12.5)
Monocytes Relative: 7.7 %
Neutro Abs: 2012 cells/uL (ref 1500–7800)
Neutrophils Relative %: 47.9 %
Platelets: 215 10*3/uL (ref 140–400)
RBC: 6.06 10*6/uL — ABNORMAL HIGH (ref 4.20–5.80)
RDW: 15.1 % — ABNORMAL HIGH (ref 11.0–15.0)
Total Lymphocyte: 42.3 %
WBC: 4.2 10*3/uL (ref 3.8–10.8)

## 2022-07-21 LAB — COMPLETE METABOLIC PANEL WITH GFR
AG Ratio: 1.7 (calc) (ref 1.0–2.5)
ALT: 45 U/L (ref 9–46)
AST: 27 U/L (ref 10–40)
Albumin: 4.4 g/dL (ref 3.6–5.1)
Alkaline phosphatase (APISO): 29 U/L — ABNORMAL LOW (ref 36–130)
BUN/Creatinine Ratio: 10 (calc) (ref 6–22)
BUN: 15 mg/dL (ref 7–25)
CO2: 27 mmol/L (ref 20–32)
Calcium: 9.2 mg/dL (ref 8.6–10.3)
Chloride: 104 mmol/L (ref 98–110)
Creat: 1.43 mg/dL — ABNORMAL HIGH (ref 0.60–1.29)
Globulin: 2.6 g/dL (calc) (ref 1.9–3.7)
Glucose, Bld: 85 mg/dL (ref 65–99)
Potassium: 4.2 mmol/L (ref 3.5–5.3)
Sodium: 138 mmol/L (ref 135–146)
Total Bilirubin: 1 mg/dL (ref 0.2–1.2)
Total Protein: 7 g/dL (ref 6.1–8.1)
eGFR: 63 mL/min/{1.73_m2} (ref >=60)

## 2022-07-21 LAB — HEMOGLOBIN A1C
Hgb A1c MFr Bld: 5.3 % of total Hgb (ref <5.7)
Mean Plasma Glucose: 105 mg/dL
eAG (mmol/L): 5.8 mmol/L

## 2022-07-21 LAB — LIPID PANEL
Cholesterol: 183 mg/dL (ref <200)
HDL: 62 mg/dL (ref >=40)
LDL Cholesterol (Calc): 103 mg/dL (calc) — ABNORMAL HIGH
Non-HDL Cholesterol (Calc): 121 mg/dL (calc) (ref <130)
Total CHOL/HDL Ratio: 3 (calc) (ref <5.0)
Triglycerides: 87 mg/dL (ref <150)

## 2022-07-21 LAB — TSH: TSH: 1.86 mIU/L (ref 0.40–4.50)

## 2022-11-09 ENCOUNTER — Ambulatory Visit (HOSPITAL_BASED_OUTPATIENT_CLINIC_OR_DEPARTMENT_OTHER): Payer: Commercial Managed Care - PPO | Admitting: Family

## 2022-11-10 ENCOUNTER — Ambulatory Visit: Payer: Commercial Managed Care - PPO | Admitting: Family Medicine

## 2022-12-25 ENCOUNTER — Telehealth: Payer: Self-pay | Admitting: Pharmacist

## 2022-12-25 NOTE — Telephone Encounter (Signed)
  Chronic Care Management   Outreach Note  12/25/2022 Name: Carl Rice MRN: [id number] DOB: 1981/03/28   Patient appearing on report for [employer] Hypertension Control due to last documented ambulatory blood pressure of 150/110 on 07/19/2022. Next appointment with PCP is 01/12/2023.   Outreached patient to discuss hypertension control and medication management. Was unable to reach patient via telephone today and have left HIPAA compliant voicemail asking patient to return my call.    Follow Up Plan: CM Pharmacist will outreach to patient by telephone again within the next 30 days  Wallace Cullens, PharmD, [facility] [phone]

## 2023-01-12 ENCOUNTER — Ambulatory Visit: Payer: Commercial Managed Care - PPO | Admitting: Family Medicine

## 2023-12-06 ENCOUNTER — Ambulatory Visit (INDEPENDENT_AMBULATORY_CARE_PROVIDER_SITE_OTHER): Payer: Commercial Managed Care - PPO | Admitting: Family Medicine

## 2023-12-06 ENCOUNTER — Encounter: Payer: Self-pay | Admitting: Family Medicine

## 2023-12-06 VITALS — BP 190/100 | HR 60 | Ht 69.0 in | Wt 165.0 lb

## 2023-12-06 DIAGNOSIS — K219 Gastro-esophageal reflux disease without esophagitis: Secondary | ICD-10-CM

## 2023-12-06 DIAGNOSIS — E7800 Pure hypercholesterolemia, unspecified: Secondary | ICD-10-CM | POA: Diagnosis not present

## 2023-12-06 DIAGNOSIS — M62830 Muscle spasm of back: Secondary | ICD-10-CM | POA: Diagnosis not present

## 2023-12-06 DIAGNOSIS — K2920 Alcoholic gastritis without bleeding: Secondary | ICD-10-CM

## 2023-12-06 DIAGNOSIS — I10 Essential (primary) hypertension: Secondary | ICD-10-CM | POA: Diagnosis not present

## 2023-12-06 DIAGNOSIS — Z0000 Encounter for general adult medical examination without abnormal findings: Secondary | ICD-10-CM

## 2023-12-06 DIAGNOSIS — F5104 Psychophysiologic insomnia: Secondary | ICD-10-CM

## 2023-12-06 DIAGNOSIS — A6002 Herpesviral infection of other male genital organs: Secondary | ICD-10-CM

## 2023-12-06 DIAGNOSIS — Z125 Encounter for screening for malignant neoplasm of prostate: Secondary | ICD-10-CM

## 2023-12-06 DIAGNOSIS — R7309 Other abnormal glucose: Secondary | ICD-10-CM

## 2023-12-06 DIAGNOSIS — M545 Low back pain, unspecified: Secondary | ICD-10-CM

## 2023-12-06 DIAGNOSIS — G8929 Other chronic pain: Secondary | ICD-10-CM

## 2023-12-06 DIAGNOSIS — F1021 Alcohol dependence, in remission: Secondary | ICD-10-CM | POA: Diagnosis not present

## 2023-12-06 MED ORDER — HYDROCHLOROTHIAZIDE 25 MG PO TABS: 25.0000 mg | ORAL_TABLET | Freq: Every day | ORAL | 3 refills | Status: AC

## 2023-12-06 MED ORDER — OMEPRAZOLE 40 MG PO CPDR
40.0000 mg | DELAYED_RELEASE_CAPSULE | Freq: Every day | ORAL | 2 refills | Status: DC
Start: 2023-12-06 — End: 2023-12-27

## 2023-12-06 MED ORDER — GABAPENTIN 100 MG PO CAPS: ORAL_CAPSULE | ORAL | 1 refills | Status: AC

## 2023-12-06 MED ORDER — SUCRALFATE 1 G PO TABS: 1.0000 g | ORAL_TABLET | Freq: Three times a day (TID) | ORAL | 2 refills | Status: AC

## 2023-12-06 MED ORDER — VALACYCLOVIR HCL 500 MG PO TABS
500.0000 mg | ORAL_TABLET | Freq: Two times a day (BID) | ORAL | 5 refills | Status: AC
Start: 2023-12-06 — End: ?

## 2023-12-06 MED ORDER — OLMESARTAN MEDOXOMIL 40 MG PO TABS: 40.0000 mg | ORAL_TABLET | Freq: Every day | ORAL | 3 refills | Status: AC

## 2023-12-06 NOTE — Progress Notes (Signed)
Subjective:    Patient ID: Carl Rice, male    DOB: March 11, 1981, 43 y.o.   MRN: [id number]  Carl Rice is a 43 y.o. male presenting on 12/06/2023 for Hypertension   HPI  Discussed the use of AI scribe software for clinical note transcription with the patient, who gave verbal consent to proceed.  History of Present Illness     Here for Annual Physical and Lab Orders  The patient, with a history of hypertension and gastritis, presents with a variety of new symptoms that have been ongoing for approximately three weeks.  Neck Pain / Throat Pain / GERD Esophagitis vs Gastritis? He reports a sensation of cramping in the throat and neck, which intermittently radiates to the back of the mouth, causing discomfort and a burning sensation. The patient also notes intermittent chest pain, which is exacerbated when lying down and relieved upon standing. - Known history of prior GERD / Gastritis  Back Pain / Flank Pain / Chest Wall Pain In addition to these symptoms, the patient has been experiencing back and chest wall pain and bilateral flank pain and has been unable to sleep, attributing this to stress and worry about his current symptoms. He also reports a recent acute lower back pain after a twisting movement, which has limited his mobility. History of heavy lifting.  Alcohol Dependence, in early remission The patient has recently quit smoking and drinking alcohol (1 week ago), which he believes may be contributing to his symptoms. He has not been taking any over-the-counter pain medications for his symptoms, which he describes as coming and going.  He admits to some stressors impacting mood and anxiety lately as well. He does not endorse active worsening depression or new concern diagnosis but would like to follow-up on this now that he has quit alcohol.  CHRONIC HYPERTENSION Note past med list - Lisinopril (cough), Amlodipine The patient's hypertension appears to be poorly controlled, with  home readings ranging from 150 to 165 mmHg, and a reading of 190/100 mmHg during the current consultation. He has run out of his prescribed antihypertensive medications and has not been adhering to his regular exercise routine due to personal commitments. Fam history of HTN. Recently quit alcohol 1 week ago Denies CP, dyspnea, HA, edema, dizziness / lightheadedness   HSV Need re order Valtrex      12/06/2023    8:02 AM 11/18/2021    9:12 AM 10/10/2021    9:11 AM  Depression screen PHQ 2/9  Decreased Interest 3 2 0  Down, Depressed, Hopeless 1 1 0  PHQ - 2 Score 4 3 0  Altered sleeping 2 0   Tired, decreased energy 1 0   Change in appetite 0 0   Feeling bad or failure about yourself  0 1   Trouble concentrating 0 0   Moving slowly or fidgety/restless 0 0   Suicidal thoughts 0 0   PHQ-9 Score 7 4   Difficult doing work/chores Not difficult at all Not difficult at all        12/06/2023    8:02 AM 11/18/2021    9:15 AM  GAD 7 : Generalized Anxiety Score  Nervous, Anxious, on Edge 1 0  Control/stop worrying 2 0  Worry too much - different things 2 1  Trouble relaxing 1 0  Restless 0 0  Easily annoyed or irritable 1 0  Afraid - awful might happen 1 0  Total GAD 7 Score 8 1  Anxiety Difficulty Somewhat difficult Not  difficult at all     Past Medical History:  Diagnosis Date   Genital herpes    History reviewed. No pertinent surgical history. Social History   Socioeconomic History   Marital status: Single    Spouse name: Not on file   Number of children: Not on file   Years of education: Not on file   Highest education level: Not on file  Occupational History   Not on file  Tobacco Use   Smoking status: Former   Smokeless tobacco: Former  Substance and Sexual Activity   Alcohol use: Yes    Alcohol/week: 0.0 standard drinks of alcohol   Drug use: Not Currently    Types: Marijuana    Comment: quit 09/2020   Sexual activity: Not on file  Other Topics Concern    Not on file  Social History Narrative   Not on file   Social Drivers of Health   Financial Resource Strain: Not on file  Food Insecurity: Not on file  Transportation Needs: Not on file  Physical Activity: Not on file  Stress: Not on file  Social Connections: Not on file  Intimate Partner Violence: Not on file   Family History  Problem Relation Age of Onset   Hypertension Mother    Drug abuse Mother    Drug abuse Father    Hypertension Father    Kidney disease Son    No current outpatient medications on file prior to visit.   No current facility-administered medications on file prior to visit.    Review of Systems  Constitutional:  Negative for activity change, appetite change, chills, diaphoresis, fatigue and fever.  HENT:  Negative for congestion and hearing loss.   Eyes:  Negative for visual disturbance.  Respiratory:  Negative for cough, chest tightness, shortness of breath and wheezing.   Cardiovascular:  Negative for chest pain, palpitations and leg swelling.  Gastrointestinal:  Positive for nausea. Negative for abdominal pain, constipation, diarrhea and vomiting.       Reflux  Genitourinary:  Negative for dysuria, frequency and hematuria.  Musculoskeletal:  Positive for arthralgias and back pain. Negative for neck pain.  Skin:  Negative for rash.  Neurological:  Negative for dizziness, weakness, light-headedness, numbness and headaches.  Hematological:  Negative for adenopathy.  Psychiatric/Behavioral:  Positive for dysphoric mood and sleep disturbance. Negative for behavioral problems. The patient is nervous/anxious.    Per HPI unless specifically indicated above     Objective:    BP (!) 190/100 (BP Location: Left Arm, Cuff Size: Normal)   Pulse 60   Ht 5\' 9"  (1.753 m)   Wt 165 lb (74.8 kg)   BMI 24.37 kg/m   Wt Readings from Last 3 Encounters:  12/06/23 165 lb (74.8 kg)  07/19/22 182 lb 3.2 oz (82.6 kg)  06/17/22 180 lb (81.6 kg)    Physical  Exam Vitals and nursing note reviewed.  Constitutional:      General: He is not in acute distress.    Appearance: He is well-developed. He is not diaphoretic.     Comments: Well-appearing, comfortable, cooperative  HENT:     Head: Normocephalic and atraumatic.  Eyes:     General:        Right eye: No discharge.        Left eye: No discharge.     Conjunctiva/sclera: Conjunctivae normal.     Pupils: Pupils are equal, round, and reactive to light.  Neck:     Thyroid: No thyromegaly.  Cardiovascular:  Rate and Rhythm: Normal rate and regular rhythm.     Pulses: Normal pulses.     Heart sounds: Normal heart sounds. No murmur heard. Pulmonary:     Effort: Pulmonary effort is normal. No respiratory distress.     Breath sounds: Normal breath sounds. No wheezing or rales.  Chest:     Chest wall: Tenderness (to palpation lateral over ribs) present.  Abdominal:     General: Bowel sounds are normal. There is no distension.     Palpations: Abdomen is soft. There is no mass.     Tenderness: There is no abdominal tenderness.  Musculoskeletal:        General: No tenderness. Normal range of motion.     Cervical back: Normal range of motion and neck supple.     Right lower leg: No edema.     Left lower leg: No edema.     Comments: Upper / Lower Extremities: - Normal muscle tone, strength bilateral upper extremities 5/5, lower extremities 5/5  Lymphadenopathy:     Cervical: No cervical adenopathy.  Skin:    General: Skin is warm and dry.     Findings: No erythema or rash.  Neurological:     Mental Status: He is alert and oriented to person, place, and time.     Comments: Distal sensation intact to light touch all extremities  Psychiatric:        Mood and Affect: Mood normal.        Behavior: Behavior normal.        Thought Content: Thought content normal.     Comments: Well groomed, good eye contact, normal speech and thoughts     Results for orders placed or performed in visit on  07/20/22  TSH   Collection Time: 07/20/22  7:56 AM  Result Value Ref Range   TSH 1.86 0.40 - 4.50 mIU/L  Hemoglobin A1c   Collection Time: 07/20/22  7:56 AM  Result Value Ref Range   Hgb A1c MFr Bld 5.3 <5.7 % of total Hgb   Mean Plasma Glucose 105 mg/dL   eAG (mmol/L) 5.8 mmol/L  Lipid panel   Collection Time: 07/20/22  7:56 AM  Result Value Ref Range   Cholesterol 183 <200 mg/dL   HDL 62 > OR = 40 mg/dL   Triglycerides 87 <562 mg/dL   LDL Cholesterol (Calc) 103 (H) mg/dL (calc)   Total CHOL/HDL Ratio 3.0 <5.0 (calc)   Non-HDL Cholesterol (Calc) 121 <130 mg/dL (calc)  CBC with Differential/Platelet   Collection Time: 07/20/22  7:56 AM  Result Value Ref Range   WBC 4.2 3.8 - 10.8 Thousand/uL   RBC 6.06 (H) 4.20 - 5.80 Million/uL   Hemoglobin 15.1 13.2 - 17.1 g/dL   HCT 13.0 86.5 - 78.4 %   MCV 79.0 (L) 80.0 - 100.0 fL   MCH 24.9 (L) 27.0 - 33.0 pg   MCHC 31.5 (L) 32.0 - 36.0 g/dL   RDW 69.6 (H) 29.5 - 28.4 %   Platelets 215 140 - 400 Thousand/uL   MPV 11.4 7.5 - 12.5 fL   Neutro Abs 2,012 1,500 - 7,800 cells/uL   Lymphs Abs 1,777 850 - 3,900 cells/uL   Absolute Monocytes 323 200 - 950 cells/uL   Eosinophils Absolute 80 15 - 500 cells/uL   Basophils Absolute 8 0 - 200 cells/uL   Neutrophils Relative % 47.9 %   Total Lymphocyte 42.3 %   Monocytes Relative 7.7 %   Eosinophils Relative 1.9 %  Basophils Relative 0.2 %  COMPLETE METABOLIC PANEL WITH GFR   Collection Time: 07/20/22  7:56 AM  Result Value Ref Range   Glucose, Bld 85 65 - 99 mg/dL   BUN 15 7 - 25 mg/dL   Creat 1.61 (H) 0.96 - 1.29 mg/dL   eGFR 63 > OR = 60 EA/VWU/9.81X9   BUN/Creatinine Ratio 10 6 - 22 (calc)   Sodium 138 135 - 146 mmol/L   Potassium 4.2 3.5 - 5.3 mmol/L   Chloride 104 98 - 110 mmol/L   CO2 27 20 - 32 mmol/L   Calcium 9.2 8.6 - 10.3 mg/dL   Total Protein 7.0 6.1 - 8.1 g/dL   Albumin 4.4 3.6 - 5.1 g/dL   Globulin 2.6 1.9 - 3.7 g/dL (calc)   AG Ratio 1.7 1.0 - 2.5 (calc)   Total  Bilirubin 1.0 0.2 - 1.2 mg/dL   Alkaline phosphatase (APISO) 29 (L) 36 - 130 U/L   AST 27 10 - 40 U/L   ALT 45 9 - 46 U/L      Assessment & Plan:   Problem List Items Addressed This Visit     Alcohol dependence in early full remission (HCC)   Essential hypertension   Relevant Medications   hydrochlorothiazide (HYDRODIURIL) 25 MG tablet   olmesartan (BENICAR) 40 MG tablet   Other Relevant Orders   COMPLETE METABOLIC PANEL WITH GFR   CBC with Differential/Platelet   Gastroesophageal reflux disease   Relevant Medications   omeprazole (PRILOSEC) 40 MG capsule   sucralfate (CARAFATE) 1 g tablet   Genital herpes   Relevant Medications   valACYclovir (VALTREX) 500 MG tablet   Other Visit Diagnoses       Annual physical exam    -  Primary   Relevant Orders   Lipid panel   Hemoglobin A1c   COMPLETE METABOLIC PANEL WITH GFR   CBC with Differential/Platelet   PSA   TSH     Elevated LDL cholesterol level       Relevant Orders   Lipid panel   TSH     Psychophysiological insomnia       Relevant Medications   gabapentin (NEURONTIN) 100 MG capsule   Other Relevant Orders   COMPLETE METABOLIC PANEL WITH GFR     Muscle spasm of back         Screening for prostate cancer       Relevant Orders   PSA     Abnormal glucose       Relevant Orders   Hemoglobin A1c     Chronic alcoholic gastritis without hemorrhage       Relevant Medications   omeprazole (PRILOSEC) 40 MG capsule   sucralfate (CARAFATE) 1 g tablet     Chronic bilateral low back pain without sciatica       Relevant Medications   gabapentin (NEURONTIN) 100 MG capsule        Updated Health Maintenance information Due for fasting labs today Encouraged improvement to lifestyle with diet and exercise Goal of weight loss   GERD / Throat Pain / Possible Gastritis related to alcohol Recent onset of intermittent throat pain, burning sensation, and discomfort in the neck. No clear etiology identified. Mild irritation  noted on examination. Possible reflux or irritation from recent exposure to fumes. -Start Omeprazole 40mg  daily for possible gastritis. -Sucralfate AS NEEDED symptom relief -Consider GI vs ENT referral if symptoms persist.  Chest Wall Pain and Back vs Flank Pain History of heavy lifting.  Worse with moving twisting. Recent onset of intermittent chest wall pain, worse when lying down. Provoked with motion and palpation. Seems to be MSK related. -Start Gabapentin for possible nerve pain and also for sleep / alcohol withdrawal symptoms  Hypertension Blood pressure significantly elevated at today's visit (190/100). Patient reports home readings of 150-165. Off of medications, uncontrolled, non adherence. Had been lost to follow-up Re order previous BP medications hydrochlorothiazide 25mg  daily + Olmesartan 40mg  daily Monitor home BP readings, if persistent >140/90 notify office -Check blood pressure in 3 weeks at follow-up Return criteria given if severe high BP and symptomatic when to seek care and evaluation at [facility] ED if needed  Insomnia Possibly related to mood / mental health / alcohol impacting sleep Severe insomnia with only 1-2 hours of sleep per night due to stress and worry about health. -Start Gabapentin for its sedative effect.  HSV Refill Valtrex  General Health Maintenance -Order comprehensive blood panel including chemistry, kidney function, liver function, prostate screening, and glucose. -Schedule follow-up appointment in 3 weeks.         Orders Placed This Encounter  Procedures   Lipid panel    Has the patient fasted?:   Yes   Hemoglobin A1c   COMPLETE METABOLIC PANEL WITH GFR   CBC with Differential/Platelet   PSA   TSH    Meds ordered this encounter  Medications   hydrochlorothiazide (HYDRODIURIL) 25 MG tablet    Sig: Take 1 tablet (25 mg total) by mouth daily.    Dispense:  90 tablet    Refill:  3   olmesartan (BENICAR) 40 MG tablet    Sig: Take  1 tablet (40 mg total) by mouth daily.    Dispense:  90 tablet    Refill:  3   gabapentin (NEURONTIN) 100 MG capsule    Sig: Start 1 capsule daily, increase by 1 cap every 2-3 days as tolerated up to 3 times a day, or may take 3 at once in evening.    Dispense:  90 capsule    Refill:  1   omeprazole (PRILOSEC) 40 MG capsule    Sig: Take 1 capsule (40 mg total) by mouth daily before breakfast.    Dispense:  30 capsule    Refill:  2   sucralfate (CARAFATE) 1 g tablet    Sig: Take 1 tablet (1 g total) by mouth 4 (four) times daily -  with meals and at bedtime. As needed    Dispense:  120 tablet    Refill:  2   valACYclovir (VALTREX) 500 MG tablet    Sig: Take 1 tablet (500 mg total) by mouth 2 (two) times daily. Take for 5 days when needed.    Dispense:  20 tablet    Refill:  5     Follow up plan: Return in about 3 weeks (around 12/27/2023) for 3 weeks follow-up BP, Mood, Sleep, GERD, updates.  Saralyn Pilar, DO [facility] 12/06/2023, 8:17 AM

## 2023-12-06 NOTE — Patient Instructions (Addendum)
Thank you for coming to the office today.  Start Gabapentin 100mg  capsules, take at night for 2-3 nights only, and then increase to 2 times a day for a few days, and then may increase to 3 times a day, it may make you drowsy, if helps significantly at night only, then you can increase instead to 3 capsules at night, instead of 3 times a day - In the future if needed, we can significantly increase the dose if tolerated well, some common doses are 300mg  three times a day up to 600mg  three times a day, usually it takes several weeks or months to get to higher doses  Can use for sleep  Restart BP medications  Omeprazole Stomach acid medication daily can help improve overall symptoms if gastritis or other flare  Sucralfate as needed for stomach symptoms GERD  Please keep in touch and return to discuss other issues if not improving  Labs today  Please schedule a Follow-up Appointment to: Return in about 3 weeks (around 12/27/2023) for 3 weeks follow-up BP, Mood, Sleep, GERD, updates.  If you have any other questions or concerns, please feel free to call the office or send a message through MyChart. You may also schedule an earlier appointment if necessary.  Additionally, you may be receiving a survey about your experience at our office within a few days to 1 week by e-mail or mail. We value your feedback.  Saralyn Pilar, DO [facility], [place]

## 2023-12-07 LAB — COMPLETE METABOLIC PANEL WITH GFR
AG Ratio: 2.1 (calc) (ref 1.0–2.5)
ALT: 31 U/L (ref 9–46)
AST: 24 U/L (ref 10–40)
Albumin: 5.1 g/dL (ref 3.6–5.1)
Alkaline phosphatase (APISO): 38 U/L (ref 36–130)
BUN: 15 mg/dL (ref 7–25)
CO2: 27 mmol/L (ref 20–32)
Calcium: 9.9 mg/dL (ref 8.6–10.3)
Chloride: 103 mmol/L (ref 98–110)
Creat: 1.27 mg/dL (ref 0.60–1.29)
Globulin: 2.4 g/dL (ref 1.9–3.7)
Glucose, Bld: 85 mg/dL (ref 65–99)
Potassium: 4.4 mmol/L (ref 3.5–5.3)
Sodium: 139 mmol/L (ref 135–146)
Total Bilirubin: 0.9 mg/dL (ref 0.2–1.2)
Total Protein: 7.5 g/dL (ref 6.1–8.1)
eGFR: 72 mL/min/{1.73_m2} (ref >=60)

## 2023-12-07 LAB — LIPID PANEL
Cholesterol: 168 mg/dL (ref <200)
HDL: 84 mg/dL (ref >=40)
LDL Cholesterol (Calc): 74 mg/dL
Non-HDL Cholesterol (Calc): 84 mg/dL (ref <130)
Total CHOL/HDL Ratio: 2 (calc) (ref <5.0)
Triglycerides: 36 mg/dL (ref <150)

## 2023-12-07 LAB — CBC WITH DIFFERENTIAL/PLATELET
Absolute Lymphocytes: 1210 {cells}/uL (ref 850–3900)
Absolute Monocytes: 353 {cells}/uL (ref 200–950)
Basophils Absolute: 8 {cells}/uL (ref 0–200)
Basophils Relative: 0.2 %
Eosinophils Absolute: 29 {cells}/uL (ref 15–500)
Eosinophils Relative: 0.7 %
HCT: 44.5 % (ref 38.5–50.0)
Hemoglobin: 14.4 g/dL (ref 13.2–17.1)
MCH: 24.9 pg — ABNORMAL LOW (ref 27.0–33.0)
MCHC: 32.4 g/dL (ref 32.0–36.0)
MCV: 77 fL — ABNORMAL LOW (ref 80.0–100.0)
MPV: 12.1 fL (ref 7.5–12.5)
Monocytes Relative: 8.4 %
Neutro Abs: 2600 {cells}/uL (ref 1500–7800)
Neutrophils Relative %: 61.9 %
Platelets: 225 10*3/uL (ref 140–400)
RBC: 5.78 10*6/uL (ref 4.20–5.80)
RDW: 14.5 % (ref 11.0–15.0)
Total Lymphocyte: 28.8 %
WBC: 4.2 10*3/uL (ref 3.8–10.8)

## 2023-12-07 LAB — HEMOGLOBIN A1C
Hgb A1c MFr Bld: 5.4 %{Hb} (ref <5.7)
Mean Plasma Glucose: 108 mg/dL
eAG (mmol/L): 6 mmol/L

## 2023-12-07 LAB — PSA: PSA: 0.45 ng/mL (ref <=4.00)

## 2023-12-11 ENCOUNTER — Encounter: Payer: Self-pay | Admitting: Family Medicine

## 2023-12-11 ENCOUNTER — Telehealth: Payer: Self-pay

## 2023-12-11 NOTE — Telephone Encounter (Signed)
Thank you! I am a bit behind on lab results this week. There have been a high volume. I am working on them now and the rest of this week.  I sent his results to his mychart this evening on 12/11/23.  Do you mind contacting him on mychart or by phone to confirm he received the results and if he has further questions?  -----------  1. Chemistry - Normal results, including electrolytes, kidney and liver function.Normal fasting blood sugar. - Compared to last year, some improved liver and kidney function.  2. Hemoglobin A1c (Diabetes screening) - normal 5.4. This is not in range of Pre-Diabetes (>5.7 to 6.4)  3. PSA Prostate Cancer Screening - Normal  4. Cholesterol - Normal - improved from last year. Lower cholesterol level looks good. No cholesterol medicine is required.  5. CBC Blood Counts - Normal, no anemia. The MCV MCH result that shows lower, have been chronically low for >10 years. These have to do with size and shape of blood cells but not actual diagnosis of anemia or low iron. This does not require treatment.  Saralyn Pilar, DO [facility] 12/11/2023, 4:57 PM  Saralyn Pilar, DO [facility] 12/11/2023, 5:01 PM

## 2023-12-11 NOTE — Telephone Encounter (Signed)
Copied from CRM [id number]. Topic: General - Other >> Dec 11, 2023  4:24 PM Epimenio Foot F wrote: Reason for CRM: Pt is calling in because he looked at his lab results online and needs someone to explain them to him. Pt is requesting someone give him a call regarding the results.

## 2023-12-12 NOTE — Telephone Encounter (Signed)
Spoke with patient, he seen the [employer] and has no further questions at this time.

## 2023-12-27 ENCOUNTER — Encounter: Payer: Self-pay | Admitting: Family Medicine

## 2023-12-27 ENCOUNTER — Ambulatory Visit: Payer: Commercial Managed Care - PPO | Admitting: Family Medicine

## 2023-12-27 VITALS — BP 160/94 | HR 64 | Ht 69.0 in | Wt 167.0 lb

## 2023-12-27 DIAGNOSIS — F1020 Alcohol dependence, uncomplicated: Secondary | ICD-10-CM

## 2023-12-27 DIAGNOSIS — K219 Gastro-esophageal reflux disease without esophagitis: Secondary | ICD-10-CM

## 2023-12-27 DIAGNOSIS — K2920 Alcoholic gastritis without bleeding: Secondary | ICD-10-CM | POA: Diagnosis not present

## 2023-12-27 DIAGNOSIS — I10 Essential (primary) hypertension: Secondary | ICD-10-CM | POA: Diagnosis not present

## 2023-12-27 MED ORDER — OMEPRAZOLE 40 MG PO CPDR: 40.0000 mg | DELAYED_RELEASE_CAPSULE | Freq: Two times a day (BID) | ORAL | 0 refills | Status: AC

## 2023-12-27 NOTE — Progress Notes (Signed)
 Subjective:    Patient ID: Carl Rice, male    DOB: 04/28/1981, 43 y.o.   MRN: [id number]  Carl Rice is a 43 y.o. male presenting on 12/27/2023 for Hypertension and Insomnia   HPI  Discussed the use of AI scribe software for clinical note transcription with the patient, who gave verbal consent to proceed.  History of Present Illness    Carl Rice is a 43 year old male with hypertension who presents for a blood pressure check and follow-up.  His home blood pressure readings have consistently ranged between 160 and 165 mmHg. He is taking olmesartan  40 mg daily but has not been taking hydrochlorothiazide  due to being unaware it was ready at the pharmacy from last visit 3-4 weeks ago. He has recently resumed going to the gym and is working on improving his physical activity.  Similar to last visit. He experiences a throbbing sensation in his throat and neck, primarily at work when exposed to fumes or small particles. These symptoms began around Christmas vacation and have persisted despite quitting smoking. He is taking omeprazole  and Carafate  for stomach acid, but the symptoms recur at work. He does not experience burning symptoms or worsening after eating, and sometimes eating alleviates the symptoms. - he is concerned about work exposure, but has not talked to anyone yet.  He has difficulty staying asleep, often waking up after three hours and being unable to return to sleep. He is taking gabapentin  100 mg at night but has not increased the dose as previously instructed. He has significantly reduced his alcohol consumption, which he believes may be affecting his sleep patterns.     Wake up 2-3 hours then cannot fall back asleep  Alcohol Dependence Still consuming alcohol, 1-2 drinks every few days or every other day     12/06/2023    8:02 AM 11/18/2021    9:12 AM 10/10/2021    9:11 AM  Depression screen PHQ 2/9  Decreased Interest 3 2 0  Down, Depressed, Hopeless 1 1 0  PHQ - 2  Score 4 3 0  Altered sleeping 2 0   Tired, decreased energy 1 0   Change in appetite 0 0   Feeling bad or failure about yourself  0 1   Trouble concentrating 0 0   Moving slowly or fidgety/restless 0 0   Suicidal thoughts 0 0   PHQ-9 Score 7 4   Difficult doing work/chores Not difficult at all Not difficult at all        12/06/2023    8:02 AM 11/18/2021    9:15 AM  GAD 7 : Generalized Anxiety Score  Nervous, Anxious, on Edge 1 0  Control/stop worrying 2 0  Worry too much - different things 2 1  Trouble relaxing 1 0  Restless 0 0  Easily annoyed or irritable 1 0  Afraid - awful might happen 1 0  Total GAD 7 Score 8 1  Anxiety Difficulty Somewhat difficult Not difficult at all    Social History   Tobacco Use   Smoking status: Former   Smokeless tobacco: Former  Substance Use Topics   Alcohol use: Yes    Alcohol/week: 0.0 standard drinks of alcohol   Drug use: Not Currently    Types: Marijuana    Comment: quit 09/2020    Review of Systems Per HPI unless specifically indicated above     Objective:    BP (!) 160/94 (BP Location: Left Arm, Cuff Size: Normal)   Pulse  64   Ht 5' 9 (1.753 m)   Wt 167 lb (75.8 kg)   SpO2 99%   BMI 24.66 kg/m   Wt Readings from Last 3 Encounters:  12/27/23 167 lb (75.8 kg)  12/06/23 165 lb (74.8 kg)  07/19/22 182 lb 3.2 oz (82.6 kg)    Physical Exam Vitals and nursing note reviewed.  Constitutional:      General: He is not in acute distress.    Appearance: He is well-developed. He is not diaphoretic.     Comments: Well-appearing, comfortable, cooperative  HENT:     Head: Normocephalic and atraumatic.  Eyes:     General:        Right eye: No discharge.        Left eye: No discharge.     Conjunctiva/sclera: Conjunctivae normal.  Neck:     Thyroid: No thyromegaly.  Cardiovascular:     Rate and Rhythm: Normal rate and regular rhythm.     Pulses: Normal pulses.     Heart sounds: Normal heart sounds. No murmur  heard. Pulmonary:     Effort: Pulmonary effort is normal. No respiratory distress.     Breath sounds: Normal breath sounds. No wheezing or rales.  Musculoskeletal:        General: Normal range of motion.     Cervical back: Normal range of motion and neck supple.  Lymphadenopathy:     Cervical: No cervical adenopathy.  Skin:    General: Skin is warm and dry.     Findings: No erythema or rash.  Neurological:     Mental Status: He is alert and oriented to person, place, and time. Mental status is at baseline.  Psychiatric:        Behavior: Behavior normal.     Comments: Well groomed, good eye contact, normal speech and thoughts     Results for orders placed or performed in visit on 12/06/23  Lipid panel   Collection Time: 12/06/23  8:39 AM  Result Value Ref Range   Cholesterol 168 <200 mg/dL   HDL 84 > OR = 40 mg/dL   Triglycerides 36 <849 mg/dL   LDL Cholesterol (Calc) 74 mg/dL (calc)   Total CHOL/HDL Ratio 2.0 <5.0 (calc)   Non-HDL Cholesterol (Calc) 84 <869 mg/dL (calc)  Hemoglobin J8r   Collection Time: 12/06/23  8:39 AM  Result Value Ref Range   Hgb A1c MFr Bld 5.4 <5.7 % of total Hgb   Mean Plasma Glucose 108 mg/dL   eAG (mmol/L) 6.0 mmol/L  COMPLETE METABOLIC PANEL WITH GFR   Collection Time: 12/06/23  8:39 AM  Result Value Ref Range   Glucose, Bld 85 65 - 99 mg/dL   BUN 15 7 - 25 mg/dL   Creat 8.72 9.39 - 8.70 mg/dL   eGFR 72 > OR = 60 fO/fpw/8.26f7   BUN/Creatinine Ratio SEE NOTE: 6 - 22 (calc)   Sodium 139 135 - 146 mmol/L   Potassium 4.4 3.5 - 5.3 mmol/L   Chloride 103 98 - 110 mmol/L   CO2 27 20 - 32 mmol/L   Calcium 9.9 8.6 - 10.3 mg/dL   Total Protein 7.5 6.1 - 8.1 g/dL   Albumin 5.1 3.6 - 5.1 g/dL   Globulin 2.4 1.9 - 3.7 g/dL (calc)   AG Ratio 2.1 1.0 - 2.5 (calc)   Total Bilirubin 0.9 0.2 - 1.2 mg/dL   Alkaline phosphatase (APISO) 38 36 - 130 U/L   AST 24 10 - 40 U/L  ALT 31 9 - 46 U/L  CBC with Differential/Platelet   Collection Time: 12/06/23   8:39 AM  Result Value Ref Range   WBC 4.2 3.8 - 10.8 Thousand/uL   RBC 5.78 4.20 - 5.80 Million/uL   Hemoglobin 14.4 13.2 - 17.1 g/dL   HCT 55.4 61.4 - 49.9 %   MCV 77.0 (L) 80.0 - 100.0 fL   MCH 24.9 (L) 27.0 - 33.0 pg   MCHC 32.4 32.0 - 36.0 g/dL   RDW 85.4 88.9 - 84.9 %   Platelets 225 140 - 400 Thousand/uL   MPV 12.1 7.5 - 12.5 fL   Neutro Abs 2,600 1,500 - 7,800 cells/uL   Absolute Lymphocytes 1,210 850 - 3,900 cells/uL   Absolute Monocytes 353 200 - 950 cells/uL   Eosinophils Absolute 29 15 - 500 cells/uL   Basophils Absolute 8 0 - 200 cells/uL   Neutrophils Relative % 61.9 %   Total Lymphocyte 28.8 %   Monocytes Relative 8.4 %   Eosinophils Relative 0.7 %   Basophils Relative 0.2 %  PSA   Collection Time: 12/06/23  8:39 AM  Result Value Ref Range   PSA 0.45 < OR = 4.00 ng/mL      Assessment & Plan:   Problem List Items Addressed This Visit     Alcohol dependence, uncomplicated (HCC)   Essential hypertension - Primary   Gastroesophageal reflux disease   Relevant Medications   omeprazole  (PRILOSEC) 40 MG capsule   Other Visit Diagnoses       Chronic alcoholic gastritis without hemorrhage       Relevant Medications   omeprazole  (PRILOSEC) 40 MG capsule         Throat and Neck Pain / ?GERD Possible work-related exposure causing symptoms. Discussed that he should talk to his employer about occupational exposure that is triggering this response, that may be the best option for treatment. I may not be able to solve this.  Some improvement with current PPI therapy. No clear structural cause identified. -Increase Omeprazole  to 40mg  twice daily before meals for 30 days. -Consider ENT referral if symptoms persist despite increased PPI therapy.  Insomnia Poor sleep quality, waking up after 3 hours. Currently on Gabapentin  100mg  at night. -Gradually increase Gabapentin  to 300mg  at night. -Consider adding Trazodone 50mg  nightly for insomnia if sleep does not  improve with increased Gabapentin  dose.  Hypertension Persistent elevated blood pressure readings (160/90) despite current medication Past med failures Lisinopril  (Cough), Amlodipine  (swelling)  - Only taking Olmesartan  40mg  daily. He did not pick up and start hydrochlorothiazide  25mg  daily. Advised him to pick this up and start -Check blood pressure readings at home and report back in one month. -Consider referral to adv hypertension specialist if blood pressure remains uncontrolled despite addition of Hydrochlorothiazide . Note concerns with history of heavy alcohol use, may be contributing. Also question poor sleep impacting his BP.        No orders of the defined types were placed in this encounter.   Meds ordered this encounter  Medications   omeprazole  (PRILOSEC) 40 MG capsule    Sig: Take 1 capsule (40 mg total) by mouth 2 (two) times daily before a meal.    Dispense:  60 capsule    Refill:  0    Dose increase for 30 days to TWICE A DAY    Follow up plan: Return in about 4 weeks (around 01/24/2024) for 1 month follow-up HTN, Sleep, Throat/GERD?SABRA   Marsa Officer, DO [facility] 12/27/2023, 8:35 AM

## 2023-12-27 NOTE — Patient Instructions (Addendum)
 Thank you for coming to the office today.  Dose increase Omeprazole  from 40mg  daily before breakfast, to now TWICE a day before meals. For 30 day trial.  Talk to the work about the fumes if still having problems We can consider ENT referral if needed.  Dose increase Gabapentin  100mg  capsules in the evening before bed, can go up to TWO tonight, then tomorrow quickly go up to THREE if the two was not as effective. 300mg  nightly let me know if this works for sleep and we can re order  If still not sleeping through the night or mostly sleeping well, we can order Trazodone 50mg  nightly for insomnia.  Pick up the hydrochlorothiazide  25mg  daily again from pharmacy, make sure they can have it ready for you  Take both Olmesartan  40 + hydrochlorothiazide  25mg  daily and see if BP readings improve  Goal < 140 / 90  Call or message with BP readings by the end of the month. Also update if the double dose stomach acid pill works or if you need to return to the once daily   Please schedule a Follow-up Appointment to: Return in about 3 months (around 03/25/2024) for 3 month follow-up HTN, Sleep, Throat/GERD?.  If you have any other questions or concerns, please feel free to call the office or send a message through MyChart. You may also schedule an earlier appointment if necessary.  Additionally, you may be receiving a survey about your experience at our office within a few days to 1 week by e-mail or mail. We value your feedback.  Marsa Officer, DO [facility], [place]

## 2024-01-24 ENCOUNTER — Ambulatory Visit: Payer: Commercial Managed Care - PPO | Admitting: Family Medicine

## 2024-08-25 ENCOUNTER — Telehealth: Payer: Self-pay

## 2024-08-25 NOTE — Telephone Encounter (Signed)
 Copied from CRM #[id number]. Topic: General - Other >> Aug 25, 2024 12:20 PM Jasmin G wrote: Reason for CRM: Pt called regarding recent missed phone call, I could not find info to relay. Please call pt back if needed at [phone].

## 2024-08-25 NOTE — Telephone Encounter (Signed)
 Copied from CRM [id number]. Topic: Clinical - Lab/Test Results >> Aug 22, 2024  4:25 PM Joesph B wrote: Reason for CRM: patient would like to come in for blood work and a check up

## 2024-09-12 ENCOUNTER — Ambulatory Visit: Admitting: Family Medicine
